# Patient Record
Sex: Female | Born: 1995 | Race: Black or African American | Hispanic: No | Marital: Single | Smoking: Never smoker
Health system: Southern US, Community
[De-identification: ages and names within clinical notes are randomized; demographics above are authoritative.]

## PROBLEM LIST (undated history)

## (undated) DIAGNOSIS — E0500 Thyrotoxicosis with diffuse goiter without thyrotoxic crisis or storm: Secondary | ICD-10-CM

---

## 2015-08-18 ENCOUNTER — Emergency Department (HOSPITAL_COMMUNITY): Payer: Self-pay

## 2015-08-18 ENCOUNTER — Encounter (HOSPITAL_COMMUNITY): Payer: Self-pay | Admitting: *Deleted

## 2015-08-18 ENCOUNTER — Emergency Department (HOSPITAL_COMMUNITY)
Admission: EM | Admit: 2015-08-18 | Discharge: 2015-08-18 | Disposition: A | Discharge status: Home / Self Care | Payer: Medicaid - Out of State | Attending: Emergency Medicine | Admitting: Emergency Medicine

## 2015-08-18 DIAGNOSIS — W01198A Fall on same level from slipping, tripping and stumbling with subsequent striking against other object, initial encounter: Secondary | ICD-10-CM | POA: Insufficient documentation

## 2015-08-18 DIAGNOSIS — R55 Syncope and collapse: Secondary | ICD-10-CM | POA: Insufficient documentation

## 2015-08-18 DIAGNOSIS — Y9389 Activity, other specified: Secondary | ICD-10-CM | POA: Insufficient documentation

## 2015-08-18 DIAGNOSIS — Y998 Other external cause status: Secondary | ICD-10-CM | POA: Insufficient documentation

## 2015-08-18 DIAGNOSIS — Y9289 Other specified places as the place of occurrence of the external cause: Secondary | ICD-10-CM | POA: Insufficient documentation

## 2015-08-18 DIAGNOSIS — S0990XA Unspecified injury of head, initial encounter: Secondary | ICD-10-CM | POA: Insufficient documentation

## 2015-08-18 LAB — I-STAT BETA HCG BLOOD, ED (MC, WL, AP ONLY): I-stat hCG, quantitative: <5 m[IU]/mL (ref <5)

## 2015-08-18 LAB — CBC
HEMATOCRIT: 37.4 % (ref 36.0–46.0)
HEMOGLOBIN: 12.1 g/dL (ref 12.0–15.0)
MCH: 26.2 pg (ref 26.0–34.0)
MCHC: 32.4 g/dL (ref 30.0–36.0)
MCV: 81.1 fL (ref 78.0–100.0)
Platelets: 237 10*3/uL (ref 150–400)
RBC: 4.61 MIL/uL (ref 3.87–5.11)
RDW: 15.9 % — AB (ref 11.5–15.5)
WBC: 4.3 10*3/uL (ref 4.0–10.5)

## 2015-08-18 LAB — BASIC METABOLIC PANEL
ANION GAP: 8 (ref 5–15)
BUN: 12 mg/dL (ref 6–20)
CHLORIDE: 105 mmol/L (ref 101–111)
CO2: 24 mmol/L (ref 22–32)
Calcium: 9.3 mg/dL (ref 8.9–10.3)
Creatinine, Ser: 0.76 mg/dL (ref 0.44–1.00)
GFR calc non Af Amer: >60 mL/min (ref >60)
Glucose, Bld: 89 mg/dL (ref 65–99)
Potassium: 4.1 mmol/L (ref 3.5–5.1)
Sodium: 137 mmol/L (ref 135–145)

## 2015-08-18 LAB — URINALYSIS, ROUTINE W REFLEX MICROSCOPIC
Bilirubin Urine: NEGATIVE
Glucose, UA: NEGATIVE mg/dL
Ketones, ur: NEGATIVE mg/dL
LEUKOCYTES UA: NEGATIVE
NITRITE: NEGATIVE
PH: 5.5 (ref 5.0–8.0)
Protein, ur: NEGATIVE mg/dL
SPECIFIC GRAVITY, URINE: 1.015 (ref 1.005–1.030)
Urobilinogen, UA: 0.2 mg/dL (ref 0.0–1.0)

## 2015-08-18 LAB — URINE MICROSCOPIC-ADD ON

## 2015-08-18 MED ORDER — NAPROXEN 500 MG PO TABS: 500.0000 mg | ORAL_TABLET | Freq: Two times a day (BID) | ORAL | Status: DC

## 2015-08-18 NOTE — ED Provider Notes (Signed)
CSN: [id number]     Arrival date & time 08/18/15  1110 History   First MD Initiated Contact with Patient 08/18/15 1632     Chief Complaint  Patient presents with  . Loss of Consciousness    HPI Pt was at a party dancing.  She felt like she was getting overheated.  She started to feel short of breath.  She tried to walk outside but was not able to get outside in time.  Her friend was with her and she fell to the ground.  She hit her head hard on the ground.  Her eyes rolled back.  She jerked a few times but quickly regained consiousness.  She came to the ED because her head is still hurting.   History reviewed. No pertinent past medical history. History reviewed. No pertinent past surgical history. History reviewed. No pertinent family history. Social History  Substance Use Topics  . Smoking status: Never Smoker   . Smokeless tobacco: None  . Alcohol Use: No   OB History    No data available     Review of Systems  Constitutional: Negative for fever and fatigue.  Respiratory: Negative for shortness of breath.   Cardiovascular: Negative for chest pain.  Gastrointestinal: Negative for vomiting, abdominal pain and diarrhea.  Musculoskeletal: Negative for joint swelling.  Neurological: Positive for syncope and headaches. Negative for dizziness.      Allergies  Review of patient's allergies indicates no known allergies.  Home Medications   Prior to Admission medications   Medication Sig Start Date End Date Taking? Authorizing Provider  naproxen (NAPROSYN) 500 MG tablet Take 1 tablet (500 mg total) by mouth 2 (two) times daily. 08/18/15   Linwood Dibbles, MD   BP 115/73 mmHg  Pulse 62  Temp(Src) 98.1 F (36.7 C) (Oral)  Resp 18  Ht  (1.626 m)  Wt 149 lb (67.586 kg)  BMI 25.56 kg/m2  SpO2 98%  LMP 07/29/2015 Physical Exam  Constitutional: She appears well-developed and well-nourished. No distress.  HENT:  Head: Normocephalic and atraumatic.  Right Ear: External ear  normal.  Left Ear: External ear normal.  Eyes: Conjunctivae are normal. Right eye exhibits no discharge. Left eye exhibits no discharge. No scleral icterus.  Neck: Neck supple. No tracheal deviation present.  Cardiovascular: Normal rate, regular rhythm and intact distal pulses.   Pulmonary/Chest: Effort normal and breath sounds normal. No stridor. No respiratory distress. She has no wheezes. She has no rales.  Abdominal: Soft. Bowel sounds are normal. She exhibits no distension. There is no tenderness. There is no rebound and no guarding.  Musculoskeletal: She exhibits no edema or tenderness.  Neurological: She is alert. She has normal strength. No cranial nerve deficit (no facial droop, extraocular movements intact, no slurred speech) or sensory deficit. She exhibits normal muscle tone. She displays no seizure activity. Coordination normal.  Skin: Skin is warm and dry. No rash noted.  Psychiatric: She has a normal mood and affect.  Nursing note and vitals reviewed.   ED Course  Procedures (including critical care time) Labs Review Labs Reviewed  CBC - Abnormal; Notable for the following:    RDW 15.9 (*)    All other components within normal limits  URINALYSIS, ROUTINE W REFLEX MICROSCOPIC (NOT AT [facility]) - Abnormal; Notable for the following:    Hgb urine dipstick SMALL (*)    All other components within normal limits  BASIC METABOLIC PANEL  URINE MICROSCOPIC-ADD ON  I-STAT BETA HCG BLOOD, ED ([facility],  [facility], [facility] ONLY)    Imaging Review Ct Head Wo Contrast  08/18/2015   CLINICAL DATA:  Syncopal episode occurring yesterday. Patient struck head, now with headache.  EXAM: CT HEAD WITHOUT CONTRAST  TECHNIQUE: Contiguous axial images were obtained from the base of the skull through the vertex without intravenous contrast.  COMPARISON:  None.  FINDINGS: No evidence for acute infarction, hemorrhage, mass lesion, hydrocephalus, or extra-axial fluid. No atrophy or white matter disease. Intact calvarium.  No acute sinus or mastoid disease.  IMPRESSION: Negative.   Electronically Signed   By: Elsie Stain M.D.   On: 08/18/2015 17:44    MDM   Final diagnoses:  Syncope, unspecified syncope type  Head injury without skull fracture, initial encounter    Suspect vasovagal episode with become overheated last evening.  Normal exam today.  Doubt cardiac etiology.  No significant head injury on CT.  At this time there does not appear to be any evidence of an acute emergency medical condition and the patient appears stable for discharge with appropriate outpatient follow up.     Linwood Dibbles, MD 08/18/15 [phone]

## 2015-08-18 NOTE — ED Notes (Signed)
Pt reports being at party last night and was hot and dancing, had possible syncopal episode. Reports shaking and hitting her head, has headache this am. Denies n/v. No acute distress noted. Denies etoh.

## 2015-08-18 NOTE — ED Notes (Signed)
Pt on her cell phone.  Alert talking on her cell smiling  No relief.

## 2015-08-18 NOTE — Discharge Instructions (Signed)
Concussion  A concussion, or closed-head injury, is a brain injury caused by a direct blow to the head or by a quick and sudden movement (jolt) of the head or neck. Concussions are usually not life-threatening. Even so, the effects of a concussion can be serious. If you have had a concussion before, you are more likely to experience concussion-like symptoms after a direct blow to the head.   CAUSES  · Direct blow to the head, such as from running into another player during a soccer game, being hit in a fight, or hitting your head on a hard surface.  · A jolt of the head or neck that causes the brain to move back and forth inside the skull, such as in a car crash.  SIGNS AND SYMPTOMS  The signs of a concussion can be hard to notice. Early on, they may be missed by you, family members, and health care providers. You may look fine but act or feel differently.  Symptoms are usually temporary, but they may last for days, weeks, or even longer. Some symptoms may appear right away while others may not show up for hours or days. Every head injury is different. Symptoms include:  · Mild to moderate headaches that will not go away.  · A feeling of pressure inside your head.  · Having more trouble than usual:  ¨ Learning or remembering things you have heard.  ¨ Answering questions.  ¨ Paying attention or concentrating.  ¨ Organizing daily tasks.  ¨ Making decisions and solving problems.  · Slowness in thinking, acting or reacting, speaking, or reading.  · Getting lost or being easily confused.  · Feeling tired all the time or lacking energy (fatigued).  · Feeling drowsy.  · Sleep disturbances.  ¨ Sleeping more than usual.  ¨ Sleeping less than usual.  ¨ Trouble falling asleep.  ¨ Trouble sleeping (insomnia).  · Loss of balance or feeling lightheaded or dizzy.  · Nausea or vomiting.  · Numbness or tingling.  · Increased sensitivity to:  ¨ Sounds.  ¨ Lights.  ¨ Distractions.  · Vision problems or eyes that tire  easily.  · Diminished sense of taste or smell.  · Ringing in the ears.  · Mood changes such as feeling sad or anxious.  · Becoming easily irritated or angry for little or no reason.  · Lack of motivation.  · Seeing or hearing things other people do not see or hear (hallucinations).  DIAGNOSIS  Your health care provider can usually diagnose a concussion based on a description of your injury and symptoms. He or she will ask whether you passed out (lost consciousness) and whether you are having trouble remembering events that happened right before and during your injury.  Your evaluation might include:  · A brain scan to look for signs of injury to the brain. Even if the test shows no injury, you may still have a concussion.  · Blood tests to be sure other problems are not present.  TREATMENT  · Concussions are usually treated in an emergency department, in urgent care, or at a clinic. You may need to stay in the hospital overnight for further treatment.  · Tell your health care provider if you are taking any medicines, including prescription medicines, over-the-counter medicines, and natural remedies. Some medicines, such as blood thinners (anticoagulants) and aspirin, may increase the chance of complications. Also tell your health care provider whether you have had alcohol or are taking illegal drugs. This information   may affect treatment.  · Your health care provider will send you home with important instructions to follow.  · How fast you will recover from a concussion depends on many factors. These factors include how severe your concussion is, what part of your brain was injured, your age, and how healthy you were before the concussion.  · Most people with mild injuries recover fully. Recovery can take time. In general, recovery is slower in older persons. Also, persons who have had a concussion in the past or have other medical problems may find that it takes longer to recover from their current injury.  HOME  CARE INSTRUCTIONS  General Instructions  · Carefully follow the directions your health care provider gave you.  · Only take over-the-counter or prescription medicines for pain, discomfort, or fever as directed by your health care provider.  · Take only those medicines that your health care provider has approved.  · Do not drink alcohol until your health care provider says you are well enough to do so. Alcohol and certain other drugs may slow your recovery and can put you at risk of further injury.  · If it is harder than usual to remember things, write them down.  · If you are easily distracted, try to do one thing at a time. For example, do not try to watch TV while fixing dinner.  · Talk with family members or close friends when making important decisions.  · Keep all follow-up appointments. Repeated evaluation of your symptoms is recommended for your recovery.  · Watch your symptoms and tell others to do the same. Complications sometimes occur after a concussion. Older adults with a brain injury may have a higher risk of serious complications, such as a blood clot on the brain.  · Tell your teachers, school nurse, school counselor, coach, athletic trainer, or work manager about your injury, symptoms, and restrictions. Tell them about what you can or cannot do. They should watch for:  ¨ Increased problems with attention or concentration.  ¨ Increased difficulty remembering or learning new information.  ¨ Increased time needed to complete tasks or assignments.  ¨ Increased irritability or decreased ability to cope with stress.  ¨ Increased symptoms.  · Rest. Rest helps the brain to heal. Make sure you:  ¨ Get plenty of sleep at night. Avoid staying up late at night.  ¨ Keep the same bedtime hours on weekends and weekdays.  ¨ Rest during the day. Take daytime naps or rest breaks when you feel tired.  · Limit activities that require a lot of thought or concentration. These include:  ¨ Doing homework or job-related  work.  ¨ Watching TV.  ¨ Working on the computer.  · Avoid any situation where there is potential for another head injury (football, hockey, soccer, basketball, martial arts, downhill snow sports and horseback riding). Your condition will get worse every time you experience a concussion. You should avoid these activities until you are evaluated by the appropriate follow-up health care providers.  Returning To Your Regular Activities  You will need to return to your normal activities slowly, not all at once. You must give your body and brain enough time for recovery.  · Do not return to sports or other athletic activities until your health care provider tells you it is safe to do so.  · Ask your health care provider when you can drive, ride a bicycle, or operate heavy machinery. Your ability to react may be slower after a   brain injury. Never do these activities if you are dizzy.  · Ask your health care provider about when you can return to work or school.  Preventing Another Concussion  It is very important to avoid another brain injury, especially before you have recovered. In rare cases, another injury can lead to permanent brain damage, brain swelling, or death. The risk of this is greatest during the first 7-10 days after a head injury. Avoid injuries by:  · Wearing a seat belt when riding in a car.  · Drinking alcohol only in moderation.  · Wearing a helmet when biking, skiing, skateboarding, skating, or doing similar activities.  · Avoiding activities that could lead to a second concussion, such as contact or recreational sports, until your health care provider says it is okay.  · Taking safety measures in your home.  ¨ Remove clutter and tripping hazards from floors and stairways.  ¨ Use grab bars in bathrooms and handrails by stairs.  ¨ Place non-slip mats on floors and in bathtubs.  ¨ Improve lighting in dim areas.  SEEK MEDICAL CARE IF:  · You have increased problems paying attention or  concentrating.  · You have increased difficulty remembering or learning new information.  · You need more time to complete tasks or assignments than before.  · You have increased irritability or decreased ability to cope with stress.  · You have more symptoms than before.  Seek medical care if you have any of the following symptoms for more than 2 weeks after your injury:  · Lasting (chronic) headaches.  · Dizziness or balance problems.  · Nausea.  · Vision problems.  · Increased sensitivity to noise or light.  · Depression or mood swings.  · Anxiety or irritability.  · Memory problems.  · Difficulty concentrating or paying attention.  · Sleep problems.  · Feeling tired all the time.  SEEK IMMEDIATE MEDICAL CARE IF:  · You have severe or worsening headaches. These may be a sign of a blood clot in the brain.  · You have weakness (even if only in one hand, leg, or part of the face).  · You have numbness.  · You have decreased coordination.  · You vomit repeatedly.  · You have increased sleepiness.  · One pupil is larger than the other.  · You have convulsions.  · You have slurred speech.  · You have increased confusion. This may be a sign of a blood clot in the brain.  · You have increased restlessness, agitation, or irritability.  · You are unable to recognize people or places.  · You have neck pain.  · It is difficult to wake you up.  · You have unusual behavior changes.  · You lose consciousness.  MAKE SURE YOU:  · Understand these instructions.  · Will watch your condition.  · Will get help right away if you are not doing well or get worse.  Document Released: 02/14/2004 Document Revised: 11/29/2013 Document Reviewed: 06/16/2013  [employer]® Patient Information ©2015 [employer]. This information is not intended to replace advice given to you by your health care provider. Make sure you discuss any questions you have with your health care provider.

## 2015-08-18 NOTE — ED Notes (Signed)
Pt going to ct  

## 2016-04-16 ENCOUNTER — Emergency Department (HOSPITAL_COMMUNITY)
Admission: EM | Admit: 2016-04-16 | Discharge: 2016-04-16 | Disposition: A | Discharge status: Home / Self Care | Payer: Medicaid - Out of State | Attending: Emergency Medicine | Admitting: Emergency Medicine

## 2016-04-16 DIAGNOSIS — Z791 Long term (current) use of non-steroidal anti-inflammatories (NSAID): Secondary | ICD-10-CM | POA: Insufficient documentation

## 2016-04-16 DIAGNOSIS — T50905A Adverse effect of unspecified drugs, medicaments and biological substances, initial encounter: Secondary | ICD-10-CM

## 2016-04-16 DIAGNOSIS — T887XXA Unspecified adverse effect of drug or medicament, initial encounter: Secondary | ICD-10-CM | POA: Insufficient documentation

## 2016-04-16 DIAGNOSIS — Y658 Other specified misadventures during surgical and medical care: Secondary | ICD-10-CM | POA: Insufficient documentation

## 2016-04-16 DIAGNOSIS — T391X5A Adverse effect of 4-Aminophenol derivatives, initial encounter: Secondary | ICD-10-CM | POA: Insufficient documentation

## 2016-04-16 NOTE — ED Notes (Signed)
Pt reports feeling shaky since taking generic acetaminophen this am. Denies any allergies, rash, itching, or feeling of throat/face swelling. Pt concerned for allergic reaction. No visible shaking in triage.

## 2016-04-16 NOTE — ED Provider Notes (Signed)
CSN: [id number]     Arrival date & time 04/16/16  4098 History   First MD Initiated Contact with Patient 04/16/16 1122     Chief Complaint  Patient presents with  . feels shaky      (Consider location/radiation/quality/duration/timing/severity/associated sxs/prior Treatment) HPI Comments: Margaret Moon is a 20 y.o. female presents to ED with complaint of "shakiness." Patient stated taking two  off-brand ([facility]) tylenol this morning for a headache. She gagged the pills back up and reports "shakiness" following. She did visualize both pills coming back up. States she usually only takes one pill at a time, but got "bold" and tried taking both. Patient is currently asymptomatic. She has never had a similar reaction. Denies any drug allergies. No shortness of breath, chest pain, difficulty breathing. No facial swelling, throat swelling, oral swelling, or difficulty swallowing. No rashes, hives, or itching. No fever, chills, or night sweat. No dizziness, light headedness, loss of consciousness, weakness, or paraesthesias. No abdominal pain, nausea, or vomiting.   The history is provided by the patient.    No past medical history on file. No past surgical history on file. No family history on file. Social History  Substance Use Topics  . Smoking status: Never Smoker   . Smokeless tobacco: Not on file  . Alcohol Use: No   OB History    No data available     Review of Systems  Allergic/Immunologic: Positive for environmental allergies (Seasonal - pollen).  Neurological: Positive for headaches ( Mild 3/10, "typical,normal of my headaches" ).  All other systems reviewed and are negative.     Allergies  Review of patient's allergies indicates no known allergies.  Home Medications   Prior to Admission medications   Medication Sig Start Date End Date Taking? Authorizing Provider  naproxen (NAPROSYN) 500 MG tablet Take 1 tablet (500 mg total) by mouth 2 (two) times daily.  08/18/15   Linwood Dibbles, MD   BP 131/77 mmHg  Pulse 65  Temp(Src) 98.5 F (36.9 C) (Oral)  Resp 18  SpO2 98% Physical Exam  Constitutional: She appears well-developed and well-nourished. No distress.  HENT:  Head: Normocephalic and atraumatic.  Mouth/Throat: Oropharynx is clear and moist. No oropharyngeal exudate.  Eyes: Conjunctivae and EOM are normal. Pupils are equal, round, and reactive to light. Right eye exhibits no discharge. Left eye exhibits no discharge. No scleral icterus.  Neck: Normal range of motion. Neck supple.  Cardiovascular: Normal rate, regular rhythm, normal heart sounds and intact distal pulses.   No murmur heard. Pulmonary/Chest: Effort normal and breath sounds normal. No respiratory distress.  Abdominal: Soft. Bowel sounds are normal. There is no tenderness. There is no rebound and no guarding.  Musculoskeletal: Normal range of motion.  Lymphadenopathy:    She has no cervical adenopathy.  Neurological: She is alert. She has normal strength. No cranial nerve deficit or sensory deficit. Coordination and gait normal.  Skin: Skin is warm and dry. She is not diaphoretic.  Psychiatric: She has a normal mood and affect. Her behavior is normal.    ED Course  Procedures (including critical care time) Labs Review Labs Reviewed - No data to display  Imaging Review No results found. I have personally reviewed and evaluated these images and lab results as part of my medical decision-making.   EKG Interpretation None      MDM   Final diagnoses:  Medication reaction, initial encounter    Margaret Moon is a 20 y.o. female reports to ED for "  shakiness" after taking off-brand tylenol. She is currently asymptomatic, states her symptoms are "completely resolved." Her headache is her "typical, normal headache."  Patient is afebrile, non-toxic, and VSS. She is not shaking during my exam. Physical exam is unremarkable - no facial or oral swelling, no  rashes/hives/itching, no stridor, lungs are clear to auscultation, and she is neurologically intact. Discussed with patient not using medication in future. Discussed return precautions. Patient voiced understanding and is agreeable.       Lona KettleAshley Laurel Meyer, [place] JerseyPA-C 04/16/16 [phone]

## 2016-04-16 NOTE — ED Notes (Signed)
Bed: [facility] Expected date:  Expected time:  Means of arrival:  Comments: 

## 2016-04-16 NOTE — Discharge Instructions (Signed)
Read the information below. Avoid taking the medication that caused the side effects.  You may return to the Emergency Department at any time for worsening condition or any new symptoms that concern you. Return to ED if you develop shortness of breath, chest pain, difficulty breathing, difficulty swallowing, facial/oral swelling, or loss of consciousness. Follow up with your PCP, below are resources for establishing a PCP.   AllstateCommunity Resource Guide Financial Assistance The United Ways 211 is a great source of information about community services available.  Access by dialing 2-1-1 from anywhere in [place], or by website -  PooledIncome.plwww.nc211.org.   Other Local Resources (Updated 12/2015)  Financial Assistance   Services    Phone Number and Address  [facility]  Low-cost medical care - 1st and 3rd Saturday of every month  Must not qualify for public or private insurance and must have limited income [phone] [place], [place]    [place] Department of Social Services  Child care  Emergency assistance for housing and Kimberly-Clarkutilities  Food stamps  Medicaid [phone] [address] [place], [place] [address]   [facility]  Low-cost medical care for children, communicable diseases, sexually-transmitted diseases, immunizations, maternity care, womens health and family planning [phone] [address] [place], [place] [address]  [facility]   Medication assistance for [facility] [place] residents  Must meet income requirements [phone] [address] [place], [place].    [facility]  Child care  Emergency assistance for housing and Kimberly-Clarkutilities  Food stamps  Medicaid [phone] [address] [place], [place] [address]  [facility]   Low-cost medical care,   Monday through Friday, 9 am to 6 pm.    Accepts Medicare/Medicaid, and self-pay [phone] [address] [place], [place] [address]  [facility]  Low-cost medical care - Monday through Friday, 8:30 am - 5:30 pm  Accepts Medicaid and self-pay [phone] [address] [place], [place] [address]   [facility]  Primary medical care, including for those with sickle cell disease  Accepts Medicare, Medicaid, insurance and self-pay [phone] [address] [place], [place]  [facility]   Primary medical care  Accepts Medicare, [place], insurance and self-pay [phone] [place], [place] [address]   [facility] Social Services  Child care  Emergency assistance for housing and Kimberly-Clarkutilities  Food stamps  Medicaid [phone] [address] [place], [place] [address]  [facility] and [employer]Human Services  Child care  Emergency assistance for housing and Kimberly-Clarkutilities  Food stamps  Medicaid [phone] [address] [place], [place] [address]   [facility]  Medication assistance for [facility] [place] residents with no insurance only  Must have a primary care doctor [phone] [address] [place], [place]  [facility]   Primary medical care  [place] Medicare, [place], insurance  [phone] [address] [place], [place]  [facility]   Medication assistance [phone]  [facility]   Primary medical care  Accepts Medicare, [place], insurance and self-pay [phone] [address] [place], [place] [address]  [facility]   Primary medical care  Accepts Medicare, [place], insurance and self-pay [phone] [address] [place], [place] [address]  [facility]  For [place] residents between the ages  of 18 and 64 who do not have  any form of health insurance, Medicare, Medicaid, or VA benefits.  Services are provided free of charge to uninsured patients who fall within federal poverty guidelines.    Hours: Tuesdays and Thursdays, 4:15 - 8 pm [phone] [address] [place], [place] [address]  [facility]     Primary medical care  Dental care  Nutritional counseling  Pharmacy  Accepts Medicaid, Medicare, most insurance.  Fees are adjusted based on ability to pay.   [phone] [facility] [address] [place], [place]  [phone] [facility] [address] [place], [place]  [phone] [facility] [place], [place]  [phone] [facility], [address] [place], [place]  [phone] [facility] [address] [place], [place]  [facility]  Womens health and family planning [phone] [address] [place], [place]  [facility] Department of Social Services  Child care  Emergency assistance for housing and [employer]ark  Medicaid [phone] N[address] [place], [place] [address]   [facility]    Ages 53 and older  Hours: Mondays and Thursdays, 7:00 am - 9:00 am Patients are seen on a first come, first served basis. [phone], [phone] [place], [place]  [facility] of Social Services  Child care  Emergency assistance for housing and [employer]ark  Medicaid [phone] [address] [place], [place] [address]  [facility] [employer]  Medication assistance  Rental assistance  Food pantry  Medication assistance  Housing assistance  Emergency food distribution  Utility assistance [phone] [address] [place], [place]  [phone]  [address] [place], [place] [address] Hours: Tuesdays and Thursdays from 9am -  12 noon by appointment only  [phone] [address] [place], [place] [address]  [facility] Lanae Boast   Accepts private insurance, [place], and [place].  Payment is based on a sliding scale for those without insurance.  Hours: Mondays, Tuesdays and Thursdays, 8:30 am - 5:30 pm.   [phone] [address] [place], [place]  [facility] - Family Medicine at [facility], [place], and [place].  Payment is based on a sliding scale for those without insurance. [phone] [address] [place], [place]  [facility] - Pediatrics at E. Scientist, research (physical sciences), [employer], and [place].  Payment is based on a sliding scale for those without insurance [phone] [address], [employer], [place]  [facility] - Pediatrics at [employer], [place], and [place].  Payment is based on a sliding scale for those without insurance. [phone] [address] [place], [place]  [facility] - Pediatrics at [facility], [place], and [place].  Payment is based on a sliding scale for those without insurance. [phone], [phone] [place], [place].    [facility]  Maternity care.  Accepts Medicaid and self-pay. [phone] [address] [place], [place]

## 2017-09-16 ENCOUNTER — Encounter (HOSPITAL_COMMUNITY): Payer: Self-pay

## 2017-09-16 ENCOUNTER — Emergency Department (HOSPITAL_COMMUNITY)
Admission: EM | Admit: 2017-09-16 | Discharge: 2017-09-16 | Discharge status: Against Medical Advice | Payer: Medicaid - Out of State | Attending: Emergency Medicine | Admitting: Emergency Medicine

## 2017-09-16 DIAGNOSIS — R1084 Generalized abdominal pain: Secondary | ICD-10-CM | POA: Diagnosis present

## 2017-09-16 DIAGNOSIS — Z5321 Procedure and treatment not carried out due to patient leaving prior to being seen by health care provider: Secondary | ICD-10-CM | POA: Insufficient documentation

## 2017-09-16 DIAGNOSIS — R110 Nausea: Secondary | ICD-10-CM | POA: Insufficient documentation

## 2017-09-16 LAB — COMPREHENSIVE METABOLIC PANEL
ALK PHOS: 40 U/L (ref 38–126)
ALT: 9 U/L — AB (ref 14–54)
ANION GAP: 11 (ref 5–15)
AST: 21 U/L (ref 15–41)
Albumin: 4.1 g/dL (ref 3.5–5.0)
BILIRUBIN TOTAL: 0.5 mg/dL (ref 0.3–1.2)
BUN: 9 mg/dL (ref 6–20)
CALCIUM: 8.9 mg/dL (ref 8.9–10.3)
CHLORIDE: 106 mmol/L (ref 101–111)
CO2: 22 mmol/L (ref 22–32)
Creatinine, Ser: 0.73 mg/dL (ref 0.44–1.00)
GFR calc non Af Amer: >60 mL/min (ref >60)
Glucose, Bld: 151 mg/dL — ABNORMAL HIGH (ref 65–99)
POTASSIUM: 3.2 mmol/L — AB (ref 3.5–5.1)
SODIUM: 139 mmol/L (ref 135–145)
Total Protein: 7.3 g/dL (ref 6.5–8.1)

## 2017-09-16 LAB — I-STAT BETA HCG BLOOD, ED (MC, WL, AP ONLY)

## 2017-09-16 LAB — CBC
HEMATOCRIT: 36.1 % (ref 36.0–46.0)
HEMOGLOBIN: 12.1 g/dL (ref 12.0–15.0)
MCH: 28.4 pg (ref 26.0–34.0)
MCHC: 33.5 g/dL (ref 30.0–36.0)
MCV: 84.7 fL (ref 78.0–100.0)
Platelets: 217 10*3/uL (ref 150–400)
RBC: 4.26 MIL/uL (ref 3.87–5.11)
RDW: 14.2 % (ref 11.5–15.5)
WBC: 7.6 10*3/uL (ref 4.0–10.5)

## 2017-09-16 LAB — LIPASE, BLOOD: Lipase: 16 U/L (ref 11–51)

## 2017-09-16 MED ORDER — ONDANSETRON 4 MG PO TBDP
4.0000 mg | ORAL_TABLET | Freq: Once | ORAL | Status: AC | PRN
  Administered 2017-09-16: 4 mg via ORAL

## 2017-09-16 MED ORDER — ONDANSETRON 4 MG PO TBDP
ORAL_TABLET | ORAL | Status: AC
  Filled 2017-09-16: qty 1

## 2017-09-16 MED ORDER — OXYCODONE-ACETAMINOPHEN 5-325 MG PO TABS: 1.0000 | ORAL_TABLET | ORAL | Status: DC | PRN

## 2017-09-16 MED ORDER — OXYCODONE-ACETAMINOPHEN 5-325 MG PO TABS
ORAL_TABLET | ORAL | Status: AC
  Filled 2017-09-16: qty 1

## 2017-09-16 NOTE — ED Notes (Signed)
Called pt x3 for vitals, no response. 

## 2017-09-16 NOTE — ED Notes (Addendum)
NOT given the percocet due to return of nausea

## 2017-09-16 NOTE — ED Triage Notes (Signed)
Patient complains of abdominal cramping and nausea, vomiting and diarrhea this am. States that she thinks may be related to menstrual cycle. States that she normally doesn't have diarrhea related to period. Patient reports that she started this am

## 2017-09-16 NOTE — ED Notes (Signed)
No answer. LWBS

## 2018-03-09 ENCOUNTER — Encounter (HOSPITAL_COMMUNITY): Payer: Self-pay | Admitting: Emergency Medicine

## 2018-03-09 ENCOUNTER — Ambulatory Visit (HOSPITAL_COMMUNITY)
Admission: EM | Admit: 2018-03-09 | Discharge: 2018-03-09 | Disposition: A | Discharge status: Home / Self Care | Payer: Medicaid - Out of State | Attending: Family Medicine | Admitting: Family Medicine

## 2018-03-09 DIAGNOSIS — R112 Nausea with vomiting, unspecified: Secondary | ICD-10-CM

## 2018-03-09 DIAGNOSIS — R197 Diarrhea, unspecified: Secondary | ICD-10-CM

## 2018-03-09 DIAGNOSIS — B349 Viral infection, unspecified: Secondary | ICD-10-CM | POA: Diagnosis not present

## 2018-03-09 MED ORDER — CETIRIZINE HCL 10 MG PO TABS: 10.0000 mg | ORAL_TABLET | Freq: Every day | ORAL | 0 refills | Status: DC

## 2018-03-09 MED ORDER — ONDANSETRON 4 MG PO TBDP: 4.0000 mg | ORAL_TABLET | Freq: Three times a day (TID) | ORAL | 0 refills | Status: DC | PRN

## 2018-03-09 MED ORDER — IPRATROPIUM BROMIDE 0.06 % NA SOLN: 2.0000 | Freq: Four times a day (QID) | NASAL | 0 refills | Status: DC

## 2018-03-09 MED ORDER — DICYCLOMINE HCL 20 MG PO TABS: 20.0000 mg | ORAL_TABLET | Freq: Two times a day (BID) | ORAL | 0 refills | Status: DC

## 2018-03-09 MED ORDER — BENZONATATE 100 MG PO CAPS: 100.0000 mg | ORAL_CAPSULE | Freq: Three times a day (TID) | ORAL | 0 refills | Status: DC

## 2018-03-09 NOTE — ED Provider Notes (Signed)
[facility]    CSN: [id number] Arrival date & time: 03/09/18  1443     History   Chief Complaint Chief Complaint  Patient presents with  . Emesis    HPI Margaret Moon is a 22 y.o. female.   22 year old female comes in for 1 week history of URI symptoms. Rhinorrhea, nasal congestion, productive cough. Denies sore throat. Fever, Tmax 101 with chills. Started having episodes of nausea and vomiting last night with 3 episodes of nonbloody vomit. Diarrhea, 6 episodes.  Occasional low abdominal pain that improves after emesis and diarrhea.  Otc robittusin, theraflu with some relief.  Patient states that she started her cycle a few days ago and she does experience cyclic nausea, vomiting, weakness, dizziness. Denies syncope.  Has in the past to stay with home during these episodes. States diarrhea is not normal for her when she is on her cycle. She is sexually active with 1 female partner.      History reviewed. No pertinent past medical history.  There are no active problems to display for this patient.   History reviewed. No pertinent surgical history.  OB History   None      Home Medications    Prior to Admission medications   Medication Sig Start Date End Date Taking? Authorizing Provider  benzonatate (TESSALON) 100 MG capsule Take 1 capsule (100 mg total) by mouth every 8 (eight) hours. 03/09/18   Cathie Hoops, Tayden Nichelson V, PA-C  cetirizine (ZYRTEC) 10 MG tablet Take 1 tablet (10 mg total) by mouth daily. 03/09/18   Cathie Hoops, Shawnita Krizek V, PA-C  dicyclomine (BENTYL) 20 MG tablet Take 1 tablet (20 mg total) by mouth 2 (two) times daily. 03/09/18   Cathie Hoops, Braydyn Schultes V, PA-C  ipratropium (ATROVENT) 0.06 % nasal spray Place 2 sprays into both nostrils 4 (four) times daily. 03/09/18   Cathie Hoops, Keywon Mestre V, PA-C  naproxen (NAPROSYN) 500 MG tablet Take 1 tablet (500 mg total) by mouth 2 (two) times daily. 08/18/15   Linwood Dibbles, MD  ondansetron (ZOFRAN ODT) 4 MG disintegrating tablet Take 1 tablet (4 mg total) by mouth every 8  (eight) hours as needed for nausea or vomiting. 03/09/18   Belinda Fisher, PA-C    Family History History reviewed. No pertinent family history.  Social History Social History   Tobacco Use  . Smoking status: Never Smoker  . Smokeless tobacco: Never Used  Substance Use Topics  . Alcohol use: No  . Drug use: Yes    Types: Marijuana     Allergies   Patient has no known allergies.   Review of Systems Review of Systems  Reason unable to perform ROS: See HPI as above.     Physical Exam Triage Vital Signs ED Triage Vitals [03/09/18 1512]  Enc Vitals Group     BP 133/65     Pulse Rate 91     Resp 18     Temp 99.1 F (37.3 C)     Temp Source Oral     SpO2 98 %     Weight      Height      Head Circumference      Peak Flow      Pain Score      Pain Loc      Pain Edu?      Excl. in GC?    No data found.  Updated Vital Signs BP 133/65 (BP Location: Right Arm)   Pulse 91   Temp 99.1 F (  37.3 C) (Oral)   Resp 18   SpO2 98%   Physical Exam  Constitutional: She is oriented to person, place, and time. She appears well-developed and well-nourished. No distress.  HENT:  Head: Normocephalic and atraumatic.  Right Ear: Tympanic membrane, external ear and ear canal normal. Tympanic membrane is not erythematous and not bulging.  Left Ear: Tympanic membrane, external ear and ear canal normal. Tympanic membrane is not erythematous and not bulging.  Nose: Rhinorrhea present. Right sinus exhibits no maxillary sinus tenderness and no frontal sinus tenderness. Left sinus exhibits no maxillary sinus tenderness and no frontal sinus tenderness.  Mouth/Throat: Uvula is midline, oropharynx is clear and moist and mucous membranes are normal.  Eyes: Pupils are equal, round, and reactive to light. Conjunctivae are normal.  Neck: Normal range of motion. Neck supple.  Cardiovascular: Normal rate, regular rhythm and normal heart sounds. Exam reveals no gallop and no friction rub.  No murmur  heard. Pulmonary/Chest: Effort normal and breath sounds normal. She has no decreased breath sounds. She has no wheezes. She has no rhonchi. She has no rales.  Abdominal: Soft. Bowel sounds are normal. She exhibits no mass. There is no rigidity, no rebound, no guarding and no CVA tenderness.  Mild generalized tenderness to palpation without guarding or rebound.  Lymphadenopathy:    She has no cervical adenopathy.  Neurological: She is alert and oriented to person, place, and time. She is not disoriented. Coordination and gait normal. GCS eye subscore is 4. GCS verbal subscore is 5. GCS motor subscore is 6.  Skin: Skin is warm and dry.  Psychiatric: She has a normal mood and affect. Her behavior is normal. Judgment normal.     UC Treatments / Results  Labs (all labs ordered are listed, but only abnormal results are displayed) Labs Reviewed - No data to display  EKG None Radiology No results found.  Procedures Procedures (including critical care time)  Medications Ordered in UC Medications - No data to display   Initial Impression / Assessment and Plan / UC Course  I have reviewed the triage vital signs and the nursing notes.  Pertinent labs & imaging results that were available during my care of the patient were reviewed by me and considered in my medical decision making (see chart for details).    Discussed with patient history and exam most consistent with viral URI. Zofran for N/V. Bentyl for abdominal cramping. Other symptomatic treatment as needed. Push fluids. Return precautions given.   Will have patient follow up with GYN for cyclic N/V, weakness/dizziness.  Final Clinical Impressions(s) / UC Diagnoses   Final diagnoses:  Viral illness  Nausea vomiting and diarrhea    ED Discharge Orders        Ordered    benzonatate (TESSALON) 100 MG capsule  Every 8 hours     03/09/18 1537    cetirizine (ZYRTEC) 10 MG tablet  Daily     03/09/18 1537    ipratropium  (ATROVENT) 0.06 % nasal spray  4 times daily     03/09/18 1537    ondansetron (ZOFRAN ODT) 4 MG disintegrating tablet  Every 8 hours PRN     03/09/18 1537    dicyclomine (BENTYL) 20 MG tablet  2 times daily     03/09/18 1537        [address]Pernella Ackerley V, [place] JerseyPA-C 03/09/18 1546

## 2018-03-09 NOTE — ED Triage Notes (Signed)
Pt sts vomiting x 2 days with URI sx

## 2018-03-09 NOTE — Discharge Instructions (Addendum)
Tessalon for cough. Zofran for nausea and vomiting as needed. Start atrovent nasal spray, zyrtec for nasal congestion/drainage. You can use over the counter nasal saline rinse such as neti pot for nasal congestion. Keep hydrated, you urine should be clear to pale yellow in color. Bland diet as attached, advance as tolerated. Tylenol/motrin for fever and pain. Monitor for any worsening of symptoms, chest pain, shortness of breath, wheezing, swelling of the throat, follow up for reevaluation. If experiencing worsening abdominal pain, nausea/vomiting not controlled by medication, unable to jump up and down due to the pain, go to the emergency department for further evaluation.  [facility] to [facility], they will contact you to schedule an appointment. Please follow up for nausea, vomiting, weakness/dizziness when on cycle.

## 2018-07-19 ENCOUNTER — Emergency Department (HOSPITAL_COMMUNITY)
Admission: EM | Admit: 2018-07-19 | Discharge: 2018-07-19 | Disposition: A | Discharge status: Home / Self Care | Payer: Self-pay | Attending: Emergency Medicine | Admitting: Emergency Medicine

## 2018-07-19 ENCOUNTER — Other Ambulatory Visit: Payer: Self-pay

## 2018-07-19 DIAGNOSIS — Z79899 Other long term (current) drug therapy: Secondary | ICD-10-CM | POA: Insufficient documentation

## 2018-07-19 DIAGNOSIS — J029 Acute pharyngitis, unspecified: Secondary | ICD-10-CM | POA: Insufficient documentation

## 2018-07-19 LAB — GROUP A STREP BY PCR: Group A Strep by PCR: NOT DETECTED

## 2018-07-19 MED ORDER — AMOXICILLIN 500 MG PO CAPS: 500.0000 mg | ORAL_CAPSULE | Freq: Three times a day (TID) | ORAL | 0 refills | Status: DC

## 2018-07-19 NOTE — ED Notes (Signed)
Signature pad available at time of pt discharge. Pt verbalized understanding.

## 2018-07-19 NOTE — ED Provider Notes (Signed)
[facility] Note   CSN: [id number] Arrival date & time: 07/19/18  0401     History   Chief Complaint Chief Complaint  Patient presents with  . Sore Throat    HPI Margaret Moon is a 22 y.o. female.  Patient presents to the emergency department with chief complaint of sore throat.  She states the symptoms have been ongoing for the past 4 days.  She reports fever to 103 degrees tonight.  She took Tylenol with good improvement.  Her symptoms are worsened with swallowing.  She denies any difficulty speaking or breathing.  Denies any cough.  Denies any other associated symptoms.  The history is provided by the patient. No language interpreter was used.    No past medical history on file.  There are no active problems to display for this patient.   No past surgical history on file.   OB History   None      Home Medications    Prior to Admission medications   Medication Sig Start Date End Date Taking? Authorizing Provider  amoxicillin (AMOXIL) 500 MG capsule Take 1 capsule (500 mg total) by mouth 3 (three) times daily. 07/19/18   Roxy HorsemanBrowning, Tarez Bowns, PA-C  benzonatate (TESSALON) 100 MG capsule Take 1 capsule (100 mg total) by mouth every 8 (eight) hours. 03/09/18   Cathie HoopsYu, Amy V, PA-C  cetirizine (ZYRTEC) 10 MG tablet Take 1 tablet (10 mg total) by mouth daily. 03/09/18   Cathie HoopsYu, Amy V, PA-C  dicyclomine (BENTYL) 20 MG tablet Take 1 tablet (20 mg total) by mouth 2 (two) times daily. 03/09/18   Cathie HoopsYu, Amy V, PA-C  ipratropium (ATROVENT) 0.06 % nasal spray Place 2 sprays into both nostrils 4 (four) times daily. 03/09/18   Cathie HoopsYu, Amy V, PA-C  naproxen (NAPROSYN) 500 MG tablet Take 1 tablet (500 mg total) by mouth 2 (two) times daily. 08/18/15   Linwood DibblesKnapp, Jon, MD  ondansetron (ZOFRAN ODT) 4 MG disintegrating tablet Take 1 tablet (4 mg total) by mouth every 8 (eight) hours as needed for nausea or vomiting. 03/09/18   Belinda FisherYu, Amy V, PA-C    Family History No family  history on file.  Social History Social History   Tobacco Use  . Smoking status: Never Smoker  . Smokeless tobacco: Never Used  Substance Use Topics  . Alcohol use: No  . Drug use: Yes    Types: Marijuana     Allergies   Patient has no known allergies.   Review of Systems Review of Systems  All other systems reviewed and are negative.    Physical Exam Updated Vital Signs BP 118/82 (BP Location: Right Arm)   Pulse 100   Temp 99 F (37.2 C) (Oral)   Resp 18   Ht 5\' 4"  (1.626 m)   Wt 62.7 kg   LMP 06/26/2018 (Exact Date)   SpO2 96%   BMI 23.72 kg/m   Physical Exam  Constitutional: She is oriented to person, place, and time. She appears well-developed and well-nourished.  HENT:  Head: Normocephalic and atraumatic.  Oropharynx is erythematous with mild tonsillar exudate, no abscess, uvula is midline, airway intact, no stridor, normal phonation  Eyes: Pupils are equal, round, and reactive to light. Conjunctivae and EOM are normal.  Neck: Normal range of motion. Neck supple.  Cardiovascular: Normal rate and regular rhythm. Exam reveals no gallop and no friction rub.  No murmur heard. Pulmonary/Chest: Effort normal and breath sounds normal. No respiratory distress. She has  no wheezes. She has no rales. She exhibits no tenderness.  Abdominal: Soft. Bowel sounds are normal. She exhibits no distension and no mass. There is no tenderness. There is no rebound and no guarding.  Musculoskeletal: Normal range of motion. She exhibits no edema or tenderness.  Neurological: She is alert and oriented to person, place, and time.  Skin: Skin is warm and dry.  Psychiatric: She has a normal mood and affect. Her behavior is normal. Judgment and thought content normal.  Nursing note and vitals reviewed.    ED Treatments / Results  Labs (all labs ordered are listed, but only abnormal results are displayed) Labs Reviewed  GROUP A STREP BY PCR    EKG None  Radiology No results  found.  Procedures Procedures (including critical care time)  Medications Ordered in ED Medications - No data to display   Initial Impression / Assessment and Plan / ED Course  I have reviewed the triage vital signs and the nursing notes.  Pertinent labs & imaging results that were available during my care of the patient were reviewed by me and considered in my medical decision making (see chart for details).     Patient with sore throat x3 to 4 days.  Fever to 103 degrees tonight.  Rapid strep test is negative.    Will give amox, but have advised to not fill for 2-3 days to see if she improves.  Final Clinical Impressions(s) / ED Diagnoses   Final diagnoses:  Acute pharyngitis, unspecified etiology    ED Discharge Orders         Ordered    amoxicillin (AMOXIL) 500 MG capsule  3 times daily     07/19/18 0556           Roxy HorsemanBrowning, Leandrea Ackley, PA-C 07/19/18 0600    Palumbo, April, MD 07/19/18 [phone]

## 2018-07-19 NOTE — ED Triage Notes (Signed)
Patient c/o sore throat/difficulty swallowing sine Friday and a fever that started a few hours ago; patient took tylenol.

## 2019-06-04 ENCOUNTER — Telehealth: Payer: Self-pay | Admitting: Family

## 2019-06-04 DIAGNOSIS — M546 Pain in thoracic spine: Secondary | ICD-10-CM

## 2019-06-04 NOTE — Progress Notes (Signed)
Based on what you shared with me, I feel your condition warrants further evaluation and I recommend that you be seen for a face to face office visit.  NOTE: If you entered your credit card information for this eVisit, you will not be charged. You may see a "hold" on your card for the $35 but that hold will drop off and you will not have a charge processed.  If you are having a true medical emergency please call 911.     For an urgent face to face visit, [facility] has five urgent care centers for your convenience:    [email or web address] to reserve your spot online an avoid wait times  [facility] [address], [address] [place], [place] [address] Modified hours of operation: Monday-Friday, 12 PM to 6 PM  Closed Saturday & Sunday  *Across the street from [employer]  [facility] (New Address[facility] [address] [place], [place] [address] *Just off [address], across the road from [employer]* Modified hours of operation: Monday-Friday, 12 PM to 6 PM  Closed Saturday & Sunday   The following sites will take your insurance:  . [facility]    [phone]                  Get Driving Directions  [address] [place], [place] [address] . 10 am to 8 pm Monday-Friday . 12 pm to 8 pm Saturday-Sunday   . [facility]  [phone]                  Get Driving Directions  [address] [place], [place] [address] . 8 am to 8 pm Monday-Friday . 9 am to 6 pm Saturday . 11 am to 6 pm Sunday   . [facility]  [phone]                  Get Driving Directions   [address] [place], [place] [address] . 8 am to 8 pm Monday-Friday . 8 am to 4 pm Saturday-Sunday    . [facility] [place]                    Get Driving Directions  [phone]  [address] [place], [place] [address]   . Monday-Friday, 12 PM to 6 PM    Your e-visit answers were reviewed by a board certified advanced clinical practitioner to complete your personal care plan.  Thank you for using e-Visits. 

## 2019-06-06 ENCOUNTER — Telehealth: Payer: Self-pay | Admitting: Physician Assistant

## 2019-06-06 ENCOUNTER — Encounter: Payer: Self-pay | Admitting: Physician Assistant

## 2019-06-06 DIAGNOSIS — M549 Dorsalgia, unspecified: Secondary | ICD-10-CM

## 2019-06-06 MED ORDER — CYCLOBENZAPRINE HCL 10 MG PO TABS: 10.0000 mg | ORAL_TABLET | Freq: Three times a day (TID) | ORAL | 0 refills | Status: DC | PRN

## 2019-06-06 MED ORDER — NAPROXEN 500 MG PO TABS: 500.0000 mg | ORAL_TABLET | Freq: Two times a day (BID) | ORAL | 0 refills | Status: DC

## 2019-06-06 NOTE — Progress Notes (Signed)
We are sorry that you are not feeling well.  Here is how we plan to help!  Based on what you have shared with me it looks like you mostly have acute back pain.  Acute back pain is defined as musculoskeletal pain that can resolve in 1-3 weeks with conservative treatment.  Ms. Margaret Moon,  As per our conversation, you have indicated that you back pain is in the mid back to the right ( you stated you chose upper back as you did not midback as an option).You indicated pain "feels like muscle spasm" that you previously had when you played sports in the past. You have denied chest pain, shortness of breath, fever, chills, rash on back.  I have prescribed Naprosyn 500 mg twice a day non-steroid anti-inflammatory (NSAID) as well as Flexeril 10 mg every eight hours as needed which is a muscle relaxer  Some patients experience stomach irritation or in increased heartburn with anti-inflammatory drugs.  Please keep in mind that muscle relaxer's can cause fatigue and should not be taken while at work or driving.  Back pain is very common.  The pain often gets better over time.  The cause of back pain is usually not dangerous.  Most people can learn to manage their back pain on their own.  Home Care  Stay active.  Start with short walks on flat ground if you can.  Try to walk farther each day.  Do not sit, drive or stand in one place for more than 30 minutes.  Do not stay in bed.  Do not avoid exercise or work.  Activity can help your back heal faster.  Be careful when you bend or lift an object.  Bend at your knees, keep the object close to you, and do not twist.  Sleep on a firm mattress.  Lie on your side, and bend your knees.  If you lie on your back, put a pillow under your knees.  Only take medicines as told by your doctor.  Put ice on the injured area.  Put ice in a plastic bag  Place a towel between your skin and the bag  Leave the ice on for 15-20 minutes, 3-4 times a day for the first 2-3  days. 210 After that, you can switch between ice and heat packs.  Ask your doctor about back exercises or massage.  Avoid feeling anxious or stressed.  Find good ways to deal with stress, such as exercise.  Get Help Right Way If:  Your pain does not go away with rest or medicine.  Your pain does not go away in 1 week.  You have new problems.  You do not feel well.  The pain spreads into your legs.  You cannot control when you poop (bowel movement) or pee (urinate)  You feel sick to your stomach (nauseous) or throw up (vomit)  You have belly (abdominal) pain.  You feel like you may pass out (faint).  If you develop a fever.  Make Sure you:  Understand these instructions.  Will watch your condition  Will get help right away if you are not doing well or get worse.  Your e-visit answers were reviewed by a board certified advanced clinical practitioner to complete your personal care plan.  Depending on the condition, your plan could have included both over the counter or prescription medications.  If there is a problem please reply  once you have received a response from your provider.  Your safety is important to  [place].  If you have drug allergies check your prescription carefully.    You can use MyChart to ask questions about today's visit, request a non-urgent call back, or ask for a work or school excuse for 24 hours related to this e-Visit. If it has been greater than 24 hours you will need to follow up with your provider, or enter a new e-Visit to address those concerns.  You will get an e-mail in the next two days asking about your experience.  I hope that your e-visit has been valuable and will speed your recovery. Thank you for using e-visits.  I spent 5-10 minutes on review and completion of this note- Lacy Duverney [facility]

## 2019-08-08 ENCOUNTER — Other Ambulatory Visit: Payer: Self-pay

## 2019-08-08 ENCOUNTER — Emergency Department (HOSPITAL_COMMUNITY)
Admission: EM | Admit: 2019-08-08 | Discharge: 2019-08-08 | Disposition: A | Discharge status: Home / Self Care | Payer: Medicaid - Out of State | Attending: Emergency Medicine | Admitting: Emergency Medicine

## 2019-08-08 ENCOUNTER — Encounter (HOSPITAL_COMMUNITY): Payer: Self-pay

## 2019-08-08 DIAGNOSIS — J029 Acute pharyngitis, unspecified: Secondary | ICD-10-CM | POA: Diagnosis not present

## 2019-08-08 DIAGNOSIS — Z79899 Other long term (current) drug therapy: Secondary | ICD-10-CM | POA: Insufficient documentation

## 2019-08-08 MED ORDER — AZITHROMYCIN 250 MG PO TABS: 250.0000 mg | ORAL_TABLET | Freq: Every day | ORAL | 0 refills | Status: DC

## 2019-08-08 NOTE — Discharge Instructions (Signed)
Please return for any problem.  Follow-up with your regular care provider as instructed. °

## 2019-08-08 NOTE — ED Provider Notes (Signed)
[facility]-EMERGENCY DEPT Provider Note   CSN: [id number] Arrival date & time: 08/08/19  0847     History   Chief Complaint Chief Complaint  Patient presents with  . Sore Throat    HPI Margaret Moon is a 23 y.o. female.     23 year old female with prior medical history as detailed below presents for evaluation of her sore throat.  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  She reports gradual onset of sore throat over the last 36 hours.  She denies associated fever.  She does report pain with swallowing.  She does report white spots on her tonsils.  She denies known exposure to known sick contacts.  She denies associated shortness of breath, cough, congestion, or other acute complaint.  She repeatedly assures me that she is not pregnant.  The history is provided by the patient and medical records.  Sore Throat This is a new problem. The current episode started 2 days ago. The problem occurs constantly. The problem has not changed since onset.Pertinent negatives include no chest pain, no abdominal pain, no headaches and no shortness of breath. Nothing aggravates the symptoms. Nothing relieves the symptoms.    History reviewed. No pertinent past medical history.  There are no active problems to display for this patient.   History reviewed. No pertinent surgical history.   OB History   No obstetric history on file.      Home Medications    Prior to Admission medications   Medication Sig Start Date End Date Taking? Authorizing Provider  amoxicillin (AMOXIL) 500 MG capsule Take 1 capsule (500 mg total) by mouth 3 (three) times daily. 07/19/18   Roxy Horseman, PA-C  azithromycin (ZITHROMAX) 250 MG tablet Take 1 tablet (250 mg total) by mouth daily. Take first 2 tablets together, then 1 every day until finished. 08/08/19   Wynetta Fines, MD  benzonatate (TESSALON) 100 MG capsule Take 1 capsule (100 mg total) by mouth every 8 (eight)  hours. 03/09/18   Cathie Hoops, Amy V, PA-C  cetirizine (ZYRTEC) 10 MG tablet Take 1 tablet (10 mg total) by mouth daily. 03/09/18   Cathie Hoops, Amy V, PA-C  cyclobenzaprine (FLEXERIL) 10 MG tablet Take 1 tablet (10 mg total) by mouth 3 (three) times daily as needed for muscle spasms. 06/06/19   Demetrio Lapping, PA-C  dicyclomine (BENTYL) 20 MG tablet Take 1 tablet (20 mg total) by mouth 2 (two) times daily. 03/09/18   Cathie Hoops, Amy V, PA-C  ipratropium (ATROVENT) 0.06 % nasal spray Place 2 sprays into both nostrils 4 (four) times daily. 03/09/18   Cathie Hoops, Amy V, PA-C  naproxen (NAPROSYN) 500 MG tablet Take 1 tablet (500 mg total) by mouth 2 (two) times daily. 08/18/15   Linwood Dibbles, MD  naproxen (NAPROSYN) 500 MG tablet Take 1 tablet (500 mg total) by mouth 2 (two) times daily with a meal. 06/06/19   Illa Level M, PA-C  ondansetron (ZOFRAN ODT) 4 MG disintegrating tablet Take 1 tablet (4 mg total) by mouth every 8 (eight) hours as needed for nausea or vomiting. 03/09/18   Belinda Fisher, PA-C    Family History Family History  Problem Relation Age of Onset  . Healthy Mother   . Healthy Father     Social History Social History   Tobacco Use  . Smoking status: Never Smoker  . Smokeless tobacco: Never Used  Substance Use Topics  . Alcohol use: No  . Drug use: Yes  Types: Marijuana     Allergies   Patient has no known allergies.   Review of Systems Review of Systems  Respiratory: Negative for shortness of breath.   Cardiovascular: Negative for chest pain.  Gastrointestinal: Negative for abdominal pain.  Neurological: Negative for headaches.  All other systems reviewed and are negative.    Physical Exam Updated Vital Signs BP 134/87 (BP Location: Right Arm)   Pulse 94   Temp 99.4 F (37.4 C) (Oral)   Resp 16   Ht 5\' 3"  (1.6 m)   Wt 59 kg   LMP 07/08/2019 (Approximate)   SpO2 99%   BMI 23.03 kg/m   Physical Exam Vitals signs and nursing note reviewed.  Constitutional:      General: She is not in acute  distress.    Appearance: She is well-developed.  HENT:     Head: Normocephalic and atraumatic.     Mouth/Throat:     Mouth: Mucous membranes are moist.     Pharynx: Oropharyngeal exudate and posterior oropharyngeal erythema present. No uvula swelling.     Comments: Mild posterior pharyngeal erythema with minimal exudates on tonsils.  Uvula midline  Normal phonation  No stridor Eyes:     Conjunctiva/sclera: Conjunctivae normal.     Pupils: Pupils are equal, round, and reactive to light.  Neck:     Musculoskeletal: Normal range of motion and neck supple.  Cardiovascular:     Rate and Rhythm: Normal rate and regular rhythm.     Heart sounds: Normal heart sounds.  Pulmonary:     Effort: Pulmonary effort is normal. No respiratory distress.     Breath sounds: Normal breath sounds.  Abdominal:     General: There is no distension.     Palpations: Abdomen is soft.     Tenderness: There is no abdominal tenderness.  Musculoskeletal: Normal range of motion.        General: No deformity.  Skin:    General: Skin is warm and dry.  Neurological:     Mental Status: She is alert and oriented to person, place, and time.      ED Treatments / Results  Labs (all labs ordered are listed, but only abnormal results are displayed) Labs Reviewed - No data to display  EKG None  Radiology No results found.  Procedures Procedures (including critical care time)  Medications Ordered in ED Medications - No data to display   Initial Impression / Assessment and Plan / ED Course  I have reviewed the triage vital signs and the nursing notes.  Pertinent labs & imaging results that were available during my care of the patient were reviewed by me and considered in my medical decision making (see chart for details).        MDM  Screen complete  Margaret Moon was evaluated in Emergency Department on 08/08/2019 for the symptoms described in the history of present illness. She was  evaluated in the context of the global COVID-19 pandemic, which necessitated consideration that the patient might be at risk for infection with the SARS-CoV-2 virus that causes COVID-19. Institutional protocols and algorithms that pertain to the evaluation of patients at risk for COVID-19 are in a state of rapid change based on information released by regulatory bodies including the CDC and federal and state organizations. These policies and algorithms were followed during the patient's care in the ED.  Patient is presenting for evaluation of sore throat. Her presentation is consistent with likely strep throat (vs viral pharyngitis).  She is requesting course of antibiotics. No evidence of PTA or other significant pathology on exam.   She declines strep testing at this time.  Patient appears to be appropriate discharge.  She is advised to follow-up closely with her regular care provider.  Strict return precautions given and understood.   Final Clinical Impressions(s) / ED Diagnoses   Final diagnoses:  Acute pharyngitis, unspecified etiology    ED Discharge Orders         Ordered    azithromycin (ZITHROMAX) 250 MG tablet  Daily     08/08/19 0942           Wynetta FinesMessick, Peter C, MD 08/08/19 [phone]

## 2019-08-08 NOTE — ED Triage Notes (Signed)
Patient c/o sore throat since last night.  Patient reports a history of strep throat. Patient also c/o muscle aching,but states that she usually has this when her period is about to start.

## 2020-06-20 ENCOUNTER — Emergency Department: Payer: Medicaid - Out of State

## 2020-06-20 ENCOUNTER — Emergency Department
Admission: EM | Admit: 2020-06-20 | Discharge: 2020-06-20 | Disposition: A | Discharge status: Home / Self Care | Payer: Medicaid - Out of State | Attending: Emergency Medicine | Admitting: Emergency Medicine

## 2020-06-20 ENCOUNTER — Other Ambulatory Visit: Payer: Self-pay

## 2020-06-20 DIAGNOSIS — R112 Nausea with vomiting, unspecified: Secondary | ICD-10-CM

## 2020-06-20 DIAGNOSIS — R102 Pelvic and perineal pain: Secondary | ICD-10-CM | POA: Diagnosis not present

## 2020-06-20 LAB — URINALYSIS, COMPLETE (UACMP) WITH MICROSCOPIC
Bilirubin Urine: NEGATIVE
Glucose, UA: NEGATIVE mg/dL
Ketones, ur: 80 mg/dL — AB
Leukocytes,Ua: NEGATIVE
Nitrite: NEGATIVE
Protein, ur: 100 mg/dL — AB
Specific Gravity, Urine: 1.034 — ABNORMAL HIGH (ref 1.005–1.030)
Squamous Epithelial / HPF: NONE SEEN (ref 0–5)
pH: 5 (ref 5.0–8.0)

## 2020-06-20 LAB — COMPREHENSIVE METABOLIC PANEL
ALT: 15 U/L (ref 0–44)
AST: 25 U/L (ref 15–41)
Albumin: 4.9 g/dL (ref 3.5–5.0)
Alkaline Phosphatase: 36 U/L — ABNORMAL LOW (ref 38–126)
Anion gap: 9 (ref 5–15)
BUN: 14 mg/dL (ref 6–20)
CO2: 24 mmol/L (ref 22–32)
Calcium: 9.3 mg/dL (ref 8.9–10.3)
Chloride: 104 mmol/L (ref 98–111)
Creatinine, Ser: 0.82 mg/dL (ref 0.44–1.00)
GFR calc Af Amer: >60 mL/min (ref >60)
GFR calc non Af Amer: >60 mL/min (ref >60)
Glucose, Bld: 125 mg/dL — ABNORMAL HIGH (ref 70–99)
Potassium: 3.8 mmol/L (ref 3.5–5.1)
Sodium: 137 mmol/L (ref 135–145)
Total Bilirubin: 0.6 mg/dL (ref 0.3–1.2)
Total Protein: 8.5 g/dL — ABNORMAL HIGH (ref 6.5–8.1)

## 2020-06-20 LAB — CBC
HCT: 33.2 % — ABNORMAL LOW (ref 36.0–46.0)
Hemoglobin: 11.3 g/dL — ABNORMAL LOW (ref 12.0–15.0)
MCH: 28.5 pg (ref 26.0–34.0)
MCHC: 34 g/dL (ref 30.0–36.0)
MCV: 83.8 fL (ref 80.0–100.0)
Platelets: 184 10*3/uL (ref 150–400)
RBC: 3.96 MIL/uL (ref 3.87–5.11)
RDW: 14.4 % (ref 11.5–15.5)
WBC: 6.4 10*3/uL (ref 4.0–10.5)
nRBC: 0 % (ref 0.0–0.2)

## 2020-06-20 LAB — PREGNANCY, URINE: Preg Test, Ur: NEGATIVE

## 2020-06-20 LAB — LIPASE, BLOOD: Lipase: 26 U/L (ref 11–51)

## 2020-06-20 MED ORDER — PROMETHAZINE HCL 25 MG PO TABS
12.5000 mg | ORAL_TABLET | Freq: Once | ORAL | Status: AC
  Administered 2020-06-20: 12.5 mg via ORAL
  Filled 2020-06-20: qty 1

## 2020-06-20 MED ORDER — PROMETHAZINE HCL 12.5 MG PO TABS: 12.5000 mg | ORAL_TABLET | Freq: Four times a day (QID) | ORAL | 0 refills | Status: DC | PRN

## 2020-06-20 MED ORDER — ONDANSETRON HCL 4 MG/2ML IJ SOLN
4.0000 mg | Freq: Once | INTRAMUSCULAR | Status: AC
  Administered 2020-06-20: 4 mg via INTRAVENOUS
  Filled 2020-06-20: qty 2

## 2020-06-20 MED ORDER — SODIUM CHLORIDE 0.9 % IV BOLUS
1000.0000 mL | Freq: Once | INTRAVENOUS | Status: AC
  Administered 2020-06-20: 1000 mL via INTRAVENOUS

## 2020-06-20 NOTE — ED Notes (Signed)
Pt updated in WR, VS reassessed °

## 2020-06-20 NOTE — ED Notes (Signed)
See triage note, pt reports dry heaving since last night, unable to keep water down. Headache, abdominal pain and flank pain. Denies urinary sx. Reports fever last night with max of 100. Reports headache and overall malaise.  RR even and unlabored. Pt in NAD

## 2020-06-20 NOTE — ED Provider Notes (Signed)
Emergency Department Provider Note  ____________________________________________  Time seen: Approximately 3:16 PM  I have reviewed the triage vital signs and the nursing notes.   HISTORY  Chief Complaint Abdominal Pain and Generalized Body Aches   Historian Patient     HPI Margaret Moon is a 24 y.o. female presents to the emergency department with bilateral pelvic pain and emesis that started last night.  Patient states that she went to [employer] yesterday with her girlfriend.  She states that she had pasta and felt fine afterwards.  Patient states that she went to work later that night and started feeling nauseated.  She stated that she associated her symptoms with the onset of her menses.  She states that after she left work and through the night, she experienced multiple episodes of emesis.  She states that this is atypical for her menstrual cycle.  She denies fever and chills.  She had one episode of diarrhea.  States that she has never experienced similar symptoms in the past.  Denies a history of ovarian cyst or ovarian torsion.   History reviewed. Moon pertinent past medical history.   Immunizations up to date:  Yes.     History reviewed. Moon pertinent past medical history.  There are Moon problems to display for this patient.   History reviewed. Moon pertinent surgical history.  Prior to Admission medications   Medication Sig Start Date End Date Taking? Authorizing Provider  amoxicillin (AMOXIL) 500 MG capsule Take 1 capsule (500 mg total) by mouth 3 (three) times daily. 07/19/18   Roxy Horseman, PA-C  azithromycin (ZITHROMAX) 250 MG tablet Take 1 tablet (250 mg total) by mouth daily. Take first 2 tablets together, then 1 every day until finished. 08/08/19   Wynetta Fines, MD  benzonatate (TESSALON) 100 MG capsule Take 1 capsule (100 mg total) by mouth every 8 (eight) hours. 03/09/18   Cathie Hoops, Amy V, PA-C  cetirizine (ZYRTEC) 10 MG tablet Take 1 tablet (10 mg total)  by mouth daily. 03/09/18   Cathie Hoops, Amy V, PA-C  cyclobenzaprine (FLEXERIL) 10 MG tablet Take 1 tablet (10 mg total) by mouth 3 (three) times daily as needed for muscle spasms. 06/06/19   Demetrio Lapping, PA-C  dicyclomine (BENTYL) 20 MG tablet Take 1 tablet (20 mg total) by mouth 2 (two) times daily. 03/09/18   Cathie Hoops, Amy V, PA-C  ipratropium (ATROVENT) 0.06 % nasal spray Place 2 sprays into both nostrils 4 (four) times daily. 03/09/18   Cathie Hoops, Amy V, PA-C  naproxen (NAPROSYN) 500 MG tablet Take 1 tablet (500 mg total) by mouth 2 (two) times daily. 08/18/15   Linwood Dibbles, MD  naproxen (NAPROSYN) 500 MG tablet Take 1 tablet (500 mg total) by mouth 2 (two) times daily with a meal. 06/06/19   Illa Level M, PA-C  ondansetron (ZOFRAN ODT) 4 MG disintegrating tablet Take 1 tablet (4 mg total) by mouth every 8 (eight) hours as needed for nausea or vomiting. 03/09/18   Belinda Fisher, PA-C    Allergies Patient has Moon known allergies.  Family History  Problem Relation Age of Onset  . Healthy Mother   . Healthy Father     Social History Social History   Tobacco Use  . Smoking status: Never Smoker  . Smokeless tobacco: Never Used  Vaping Use  . Vaping Use: Never used  Substance Use Topics  . Alcohol use: Moon  . Drug use: Yes    Types: Marijuana     Review of  Systems  Constitutional: Moon fever/chills Eyes:  Moon discharge ENT: Moon upper respiratory complaints. Respiratory: Moon cough. Moon SOB/ use of accessory muscles to breath Gastrointestinal: Patient has emesis.  Genitourinary: Patient has pelvic pain.  Musculoskeletal: Negative for musculoskeletal pain. Skin: Negative for rash, abrasions, lacerations, ecchymosis.   ____________________________________________   PHYSICAL EXAM:  VITAL SIGNS: ED Triage Vitals  Enc Vitals Group     BP 06/20/20 0939 130/63     Pulse Rate 06/20/20 0939 (!) 58     Resp 06/20/20 0939 16     Temp 06/20/20 0939 98.7 F (37.1 C)     Temp Source 06/20/20 0939 Oral     SpO2  06/20/20 0939 98 %     Weight 06/20/20 0939 141 lb (64 kg)     Height 06/20/20 0939 5\' 3"  (1.6 m)     Head Circumference --      Peak Flow --      Pain Score 06/20/20 0941 9     Pain Loc --      Pain Edu? --      Excl. in GC? --      Constitutional: Alert and oriented. Well appearing and in Moon acute distress. Eyes: Conjunctivae are normal. PERRL. EOMI. Head: Atraumatic. Cardiovascular: Normal rate, regular rhythm. Normal S1 and S2.  Good peripheral circulation. Respiratory: Normal respiratory effort without tachypnea or retractions. Lungs CTAB. Good air entry to the bases with Moon decreased or absent breath sounds Gastrointestinal: Bowel sounds x 4 quadrants. Soft and nontender to palpation. Moon guarding or rigidity. Moon distention. Musculoskeletal: Full range of motion to all extremities. Moon obvious deformities noted Neurologic:  Normal for age. Moon gross focal neurologic deficits are appreciated.  Skin:  Skin is warm, dry and intact. Moon rash noted. Psychiatric: Mood and affect are normal for age. Speech and behavior are normal.   ____________________________________________   LABS (all labs ordered are listed, but only abnormal results are displayed)  Labs Reviewed  COMPREHENSIVE METABOLIC PANEL - Abnormal; Notable for the following components:      Result Value   Glucose, Bld 125 (*)    Total Protein 8.5 (*)    Alkaline Phosphatase 36 (*)    All other components within normal limits  CBC - Abnormal; Notable for the following components:   Hemoglobin 11.3 (*)    HCT 33.2 (*)    All other components within normal limits  URINALYSIS, COMPLETE (UACMP) WITH MICROSCOPIC - Abnormal; Notable for the following components:   Color, Urine YELLOW (*)    APPearance TURBID (*)    Specific Gravity, Urine 1.034 (*)    Hgb urine dipstick LARGE (*)    Ketones, ur 80 (*)    Protein, ur 100 (*)    Bacteria, UA RARE (*)    All other components within normal limits  LIPASE, BLOOD   PREGNANCY, URINE   ____________________________________________  EKG   ____________________________________________  RADIOLOGY 06/22/20, personally viewed and evaluated these images (plain radiographs) as part of my medical decision making, as well as reviewing the written report by the radiologist.  Moon results found.  ____________________________________________    PROCEDURES  Procedure(s) performed:     Procedures     Medications  promethazine (PHENERGAN) tablet 12.5 mg (has Moon administration in time range)     ____________________________________________   INITIAL IMPRESSION / ASSESSMENT AND PLAN / ED COURSE  Pertinent labs & imaging results that were available during my care of the patient were reviewed by  me and considered in my medical decision making (see chart for details).  Clinical Course as of Jun 20 1520  Wed Jun 20, 2020  1518 Chloride: 104 [JW]    Clinical Course User Index [JW] Orvil Feil, PA-C   Assessment and plan Pelvic pain Emesis 24 year old female presents to the emergency department with emesis and pelvic pain 1 day after starting her menstrual cycle.  Patient had low-grade fever and was bradycardic at triage.  She was resting comfortably in exam room.  CBC and CMP were reassuring.  Urinalysis revealed a large amount of blood which was consistent with patient's menstrual cycle.  Lipase was within reference range.  Will obtain pelvic ultrasound to rule out ovarian cyst, hemorrhagic ovarian cyst and ovarian torsion.  Pelvic ultrasound revealed Moon evidence of ovarian cyst, hemorrhagic ovarian cyst or ovarian torsion.  Patient passed a p.o. challenge after Phenergan was given.  She was discharged with a short course of Phenergan.  Return precautions were given to return with new or worsening symptoms.    ____________________________________________  FINAL CLINICAL IMPRESSION(S) / ED DIAGNOSES  Final diagnoses:   Pelvic pain      NEW MEDICATIONS STARTED DURING THIS VISIT:  ED Discharge Orders    None          This chart was dictated using voice recognition software/Dragon. Despite best efforts to proofread, errors can occur which can change the meaning. Any change was purely unintentional.     Orvil Feil, PA-C 06/20/20 1931    Phineas Semen, MD 06/20/20 [phone]

## 2020-06-20 NOTE — Discharge Instructions (Signed)
Take Phenergan every 6 hours for nausea. Tylenol and ibuprofen alternating can be taken for menstrual cramps.

## 2020-06-20 NOTE — ED Triage Notes (Signed)
Reports body aches, abdominal pain, dry heaving since last night. Denies cough, congestion, or any urinary sx. Pt alert and oriented X4, cooperative, RR even and unlabored, color WNL. Pt in NAD.

## 2020-06-21 LAB — POCT PREGNANCY, URINE: Preg Test, Ur: NEGATIVE

## 2021-03-21 ENCOUNTER — Encounter (HOSPITAL_COMMUNITY): Payer: Self-pay | Admitting: Emergency Medicine

## 2021-03-21 ENCOUNTER — Other Ambulatory Visit: Payer: Self-pay

## 2021-03-21 ENCOUNTER — Emergency Department (HOSPITAL_COMMUNITY): Payer: Medicaid - Out of State

## 2021-03-21 ENCOUNTER — Emergency Department (HOSPITAL_COMMUNITY)
Admission: EM | Admit: 2021-03-21 | Discharge: 2021-03-21 | Disposition: A | Discharge status: Home / Self Care | Payer: Medicaid - Out of State | Attending: Emergency Medicine | Admitting: Emergency Medicine

## 2021-03-21 DIAGNOSIS — R55 Syncope and collapse: Secondary | ICD-10-CM | POA: Diagnosis not present

## 2021-03-21 DIAGNOSIS — W228XXA Striking against or struck by other objects, initial encounter: Secondary | ICD-10-CM | POA: Insufficient documentation

## 2021-03-21 DIAGNOSIS — Y990 Civilian activity done for income or pay: Secondary | ICD-10-CM | POA: Diagnosis not present

## 2021-03-21 DIAGNOSIS — M542 Cervicalgia: Secondary | ICD-10-CM | POA: Insufficient documentation

## 2021-03-21 DIAGNOSIS — S0003XA Contusion of scalp, initial encounter: Secondary | ICD-10-CM | POA: Diagnosis not present

## 2021-03-21 DIAGNOSIS — S0990XA Unspecified injury of head, initial encounter: Secondary | ICD-10-CM | POA: Diagnosis present

## 2021-03-21 LAB — CBC WITH DIFFERENTIAL/PLATELET
Abs Immature Granulocytes: 0.01 10*3/uL (ref 0.00–0.07)
Basophils Absolute: 0.1 10*3/uL (ref 0.0–0.1)
Basophils Relative: 1 %
Eosinophils Absolute: 0.3 10*3/uL (ref 0.0–0.5)
Eosinophils Relative: 5 %
HCT: 35.9 % — ABNORMAL LOW (ref 36.0–46.0)
Hemoglobin: 12 g/dL (ref 12.0–15.0)
Immature Granulocytes: 0 %
Lymphocytes Relative: 24 %
Lymphs Abs: 1.2 10*3/uL (ref 0.7–4.0)
MCH: 29.9 pg (ref 26.0–34.0)
MCHC: 33.4 g/dL (ref 30.0–36.0)
MCV: 89.5 fL (ref 80.0–100.0)
Monocytes Absolute: 0.5 10*3/uL (ref 0.1–1.0)
Monocytes Relative: 10 %
Neutro Abs: 2.9 10*3/uL (ref 1.7–7.7)
Neutrophils Relative %: 60 %
Platelets: 174 10*3/uL (ref 150–400)
RBC: 4.01 MIL/uL (ref 3.87–5.11)
RDW: 14 % (ref 11.5–15.5)
WBC: 5 10*3/uL (ref 4.0–10.5)
nRBC: 0 % (ref 0.0–0.2)

## 2021-03-21 LAB — COMPREHENSIVE METABOLIC PANEL
ALT: 12 U/L (ref 0–44)
AST: 22 U/L (ref 15–41)
Albumin: 4.2 g/dL (ref 3.5–5.0)
Alkaline Phosphatase: 32 U/L — ABNORMAL LOW (ref 38–126)
Anion gap: 8 (ref 5–15)
BUN: 12 mg/dL (ref 6–20)
CO2: 22 mmol/L (ref 22–32)
Calcium: 8.9 mg/dL (ref 8.9–10.3)
Chloride: 109 mmol/L (ref 98–111)
Creatinine, Ser: 0.79 mg/dL (ref 0.44–1.00)
GFR, Estimated: >60 mL/min (ref >60)
Glucose, Bld: 78 mg/dL (ref 70–99)
Potassium: 4 mmol/L (ref 3.5–5.1)
Sodium: 139 mmol/L (ref 135–145)
Total Bilirubin: 0.4 mg/dL (ref 0.3–1.2)
Total Protein: 7.3 g/dL (ref 6.5–8.1)

## 2021-03-21 LAB — CBG MONITORING, ED: Glucose-Capillary: 78 mg/dL (ref 70–99)

## 2021-03-21 LAB — I-STAT BETA HCG BLOOD, ED (MC, WL, AP ONLY): I-stat hCG, quantitative: <5 m[IU]/mL (ref <5)

## 2021-03-21 MED ORDER — SODIUM CHLORIDE 0.9 % IV BOLUS
1000.0000 mL | Freq: Once | INTRAVENOUS | Status: AC
  Administered 2021-03-21: 1000 mL via INTRAVENOUS

## 2021-03-21 MED ORDER — LIDOCAINE 5 % EX PTCH: 1.0000 | MEDICATED_PATCH | CUTANEOUS | 0 refills | Status: DC

## 2021-03-21 MED ORDER — LIDOCAINE 5 % EX PTCH
1.0000 | MEDICATED_PATCH | CUTANEOUS | Status: DC
  Administered 2021-03-21: 1 via TRANSDERMAL
  Filled 2021-03-21: qty 1

## 2021-03-21 MED ORDER — IBUPROFEN 200 MG PO TABS
600.0000 mg | ORAL_TABLET | Freq: Once | ORAL | Status: AC
  Administered 2021-03-21: 600 mg via ORAL
  Filled 2021-03-21: qty 3

## 2021-03-21 NOTE — ED Triage Notes (Signed)
Pt arrived via EMS from work. Pt states she remembers getting very hot and then she lost consciousness. Pt was told by coworkers that she hit her head on the concrete. Pt reports feeling queasy yesterday and having some diarrhea after her dinner. Pt states she has been drinking pedialite, and that she had been feeling better until about 2315 on 03/20/2021

## 2021-03-21 NOTE — Discharge Instructions (Addendum)
Thank you for allowing me to care for you today in the Emergency Department.   You were seen today after you had a syncopal episode.  Your work-up in the ER was reassuring.  Call the number on your discharge paperwork to get established with a primary care provider for follow-up after ER visit today.  Take 650 mg of Tylenol or 600 mg of ibuprofen with food every 6 hours for pain.  You can alternate between these 2 medications every 3 hours if your pain returns.  For instance, you can take Tylenol at noon, followed by a dose of ibuprofen at 3, followed by second dose of Tylenol and 6.  You can also apply 1 lidocaine patch as directed to areas that are sore.  You can apply an ice pack for 15 to 20 minutes to your neck or to the back of your head.  The area of swelling on the back of your head should improve in the next few days.  Return to the emergency department if you develop severe chest pain, respiratory distress, if you pass out, develop new numbness, weakness, visual changes, develop uncontrollable vomiting, or have other new, concerning symptoms.

## 2021-03-21 NOTE — ED Provider Notes (Signed)
[facility]-EMERGENCY DEPT Provider Note   CSN: [id number] Arrival date & time: 03/21/21  0004     History Chief Complaint  Patient presents with  . Loss of Consciousness    Margaret Moon is a 25 y.o. female with history of anemia who presents the emergency department by EMS with a chief complaint of syncope.  The patient reports that she had been standing at work for several hours when she suddenly felt hot all over.  She had a witnessed syncopal episode and coworkers informed her that she hit her head.  She is now endorsing right-sided neck pain and a 6 out of 10 posterior headache.  She reports that yesterday she was concerned that she may have developed food poisoning as she had multiple episodes of nausea, vomiting, and diarrhea.  However, the symptoms had resolved by this morning.  She has been trying to hydrate with Pedialyte.  She denies feeling dizzy or lightheaded, chest pain, shortness of breath, visual changes, numbness, weakness, neck stiffness, fever, chills, abdominal pain, slurred speech, or facial droop.  No treatment prior to arrival.  States that she has a history of anemia secondary to heavy menstrual cycles.  Reports that her menstrual cycle is about ready to start.  No treatment prior to arrival.  Reports that she had one previous episode of syncope several years ago while she was at an outdoor event where she had been for several hours and suspects it was secondary to dehydration.  The history is provided by the patient and medical records. No language interpreter was used.       History reviewed. No pertinent past medical history.  There are no problems to display for this patient.   History reviewed. No pertinent surgical history.   OB History   No obstetric history on file.     Family History  Problem Relation Age of Onset  . Healthy Mother   . Healthy Father     Social History   Tobacco Use  . Smoking status: Never  Smoker  . Smokeless tobacco: Never Used  Vaping Use  . Vaping Use: Never used  Substance Use Topics  . Alcohol use: No  . Drug use: Yes    Types: Marijuana    Home Medications Prior to Admission medications   Medication Sig Start Date End Date Taking? Authorizing Provider  lidocaine (LIDODERM) 5 % Place 1 patch onto the skin daily. Remove & Discard patch within 12 hours or as directed by MD 03/21/21  Yes Kyliah Deanda A, PA-C    Allergies    Patient has no known allergies.  Review of Systems   Review of Systems  Constitutional: Negative for activity change, chills, diaphoresis and fever.  Respiratory: Negative for shortness of breath and wheezing.   Cardiovascular: Negative for chest pain and palpitations.  Gastrointestinal: Positive for diarrhea (resolved), nausea (resolved) and vomiting (resolved). Negative for abdominal pain and blood in stool.  Genitourinary: Negative for decreased urine volume, dysuria, urgency, vaginal bleeding and vaginal pain.  Musculoskeletal: Positive for neck pain. Negative for back pain.  Skin: Negative for rash.  Allergic/Immunologic: Negative for immunocompromised state.  Neurological: Positive for headaches. Negative for dizziness, seizures, syncope, speech difficulty, weakness and numbness.  Psychiatric/Behavioral: Negative for confusion.    Physical Exam Updated Vital Signs BP 98/61   Pulse 82   Temp 98.4 F (36.9 C) (Oral)   Resp 19   Ht 5\' 2"  (1.575 m)   Wt 50.8 kg   SpO2  100%   BMI 20.49 kg/m   Physical Exam Vitals and nursing note reviewed.  Constitutional:      General: She is not in acute distress.    Appearance: She is not ill-appearing, toxic-appearing or diaphoretic.  HENT:     Head: Normocephalic.     Comments: Hematoma noted to the occiput.  No overlying abrasions or lacerations.  No crepitus or step-offs noted to the skull.    Mouth/Throat:     Mouth: Mucous membranes are moist.  Eyes:     Extraocular Movements:  Extraocular movements intact.     Conjunctiva/sclera: Conjunctivae normal.     Pupils: Pupils are equal, round, and reactive to light.  Cardiovascular:     Rate and Rhythm: Normal rate and regular rhythm.     Pulses: Normal pulses.     Heart sounds: Normal heart sounds. No murmur heard. No friction rub. No gallop.   Pulmonary:     Effort: Pulmonary effort is normal. No respiratory distress.     Breath sounds: No stridor. No wheezing, rhonchi or rales.  Chest:     Chest wall: No tenderness.  Abdominal:     General: There is no distension.     Palpations: Abdomen is soft. There is no mass.     Tenderness: There is no abdominal tenderness. There is no right CVA tenderness, left CVA tenderness, guarding or rebound.     Hernia: No hernia is present.  Musculoskeletal:     Cervical back: Neck supple.     Right lower leg: No edema.     Left lower leg: No edema.  Skin:    General: Skin is warm.     Capillary Refill: Capillary refill takes less than 2 seconds.     Coloration: Skin is not jaundiced or pale.     Findings: No rash.  Neurological:     Mental Status: She is alert.     Comments: Cranial nerves II through XII are grossly intact.  5 out of 5 strength against resistance of the bilateral upper and lower extremities.  Sensation is intact and equal throughout.  Finger-to-nose is intact bilaterally.  No pronator drift.  Gait exam deferred at this time.  Psychiatric:        Behavior: Behavior normal.     ED Results / Procedures / Treatments   Labs (all labs ordered are listed, but only abnormal results are displayed) Labs Reviewed  CBC WITH DIFFERENTIAL/PLATELET - Abnormal; Notable for the following components:      Result Value   HCT 35.9 (*)    All other components within normal limits  COMPREHENSIVE METABOLIC PANEL - Abnormal; Notable for the following components:   Alkaline Phosphatase 32 (*)    All other components within normal limits  CBG MONITORING, ED  CBG MONITORING,  ED  I-STAT BETA HCG BLOOD, ED ([facility], [facility], [facility] ONLY)    EKG None  Radiology CT Head Wo Contrast  Result Date: 03/21/2021 CLINICAL DATA:  Fall, head injury, loss of consciousness EXAM: CT HEAD WITHOUT CONTRAST TECHNIQUE: Contiguous axial images were obtained from the base of the skull through the vertex without intravenous contrast. COMPARISON:  08/18/2015 FINDINGS: Brain: Normal anatomic configuration. No abnormal intra or extra-axial mass lesion or fluid collection. No abnormal mass effect or midline shift. No evidence of acute intracranial hemorrhage or infarct. Ventricular size is normal. Cerebellum unremarkable. Vascular: Unremarkable Skull: Intact Sinuses/Orbits: Paranasal sinuses are clear. Orbits are unremarkable. Other: Mastoid air cells and middle ear cavities are  clear. IMPRESSION: No acute intracranial injury.  No calvarial fracture. Electronically Signed   By: Helyn Numbers MD   On: 03/21/2021 02:41    Procedures Procedures   Medications Ordered in ED Medications  lidocaine (LIDODERM) 5 % 1 patch (1 patch Transdermal Patch Applied 03/21/21 0501)  sodium chloride 0.9 % bolus 1,000 mL (0 mLs Intravenous Stopped 03/21/21 0453)  ibuprofen (ADVIL) tablet 600 mg (600 mg Oral Given 03/21/21 0500)    ED Course  I have reviewed the triage vital signs and the nursing notes.  Pertinent labs & imaging results that were available during my care of the patient were reviewed by me and considered in my medical decision making (see chart for details).    MDM Rules/Calculators/A&P                          25 year old female with history of anemia who presents the emergency department by EMS with a chief complaint of witnessed syncopal preceded by prodromal symptoms.  Patient also notes that she was having nausea, vomiting, and diarrhea yesterday, but the symptoms resolved today.  Vital signs are normal.  Labs and imaging have been reviewed and independently interpreted by me.  No metabolic  derangements.  Hemoglobin is normal.  CBC is otherwise unremarkable.  CT head is unremarkable.  CT cervical spine not obtained as patient has no midline tenderness and exam is more consistent with musculoskeletal strain.  On reevaluation, patient has been successfully fluid challenge.  She had negative orthostatic vital signs.  She was ambulated and was asymptomatic.  Reports that she is feeling much improved and is ready for discharge home.  Given that she had prodromal symptoms, suspect vasovagal syncope.  Less likely orthostatic hypotension, cardiac arrhythmia, CVA, intracranial hemorrhage, dehydration.  At this time, the patient is hemodynamically stable no acute distress.  Safe for discharge home with outpatient follow-up as needed.  Patient has been given resources to get established with a PCP.  Final Clinical Impression(s) / ED Diagnoses Final diagnoses:  Vasovagal syncope  Hematoma of scalp, initial encounter  Neck pain on right side    Rx / DC Orders ED Discharge Orders         Ordered    lidocaine (LIDODERM) 5 %  Every 24 hours        03/21/21 0453           Barkley Boards, PA-C 03/21/21 0551    Nira Conn, MD 03/22/21 [phone]

## 2022-01-29 ENCOUNTER — Other Ambulatory Visit: Payer: Self-pay | Admitting: *Deleted

## 2022-01-29 DIAGNOSIS — N6310 Unspecified lump in the right breast, unspecified quadrant: Secondary | ICD-10-CM

## 2022-02-18 ENCOUNTER — Ambulatory Visit
Admission: RE | Admit: 2022-02-18 | Discharge: 2022-02-18 | Disposition: A | Discharge status: Home / Self Care | Payer: 59 | Source: Ambulatory Visit | Attending: *Deleted | Admitting: *Deleted

## 2022-02-18 DIAGNOSIS — N6310 Unspecified lump in the right breast, unspecified quadrant: Secondary | ICD-10-CM

## 2022-04-23 ENCOUNTER — Encounter (HOSPITAL_COMMUNITY): Payer: Self-pay

## 2022-04-23 ENCOUNTER — Emergency Department (HOSPITAL_COMMUNITY): Payer: 59

## 2022-04-23 ENCOUNTER — Emergency Department (HOSPITAL_COMMUNITY)
Admission: EM | Admit: 2022-04-23 | Discharge: 2022-04-23 | Disposition: A | Discharge status: Home / Self Care | Payer: 59 | Attending: Emergency Medicine | Admitting: Emergency Medicine

## 2022-04-23 DIAGNOSIS — S4992XA Unspecified injury of left shoulder and upper arm, initial encounter: Secondary | ICD-10-CM | POA: Insufficient documentation

## 2022-04-23 DIAGNOSIS — W19XXXA Unspecified fall, initial encounter: Secondary | ICD-10-CM

## 2022-04-23 DIAGNOSIS — M25512 Pain in left shoulder: Secondary | ICD-10-CM | POA: Insufficient documentation

## 2022-04-23 MED ORDER — NAPROXEN 500 MG PO TABS: 500.0000 mg | ORAL_TABLET | Freq: Two times a day (BID) | ORAL | 0 refills | Status: DC

## 2022-04-23 NOTE — Discharge Instructions (Signed)
It was a pleasure taking care of you here in the emergency department.  Your x-ray here today did not show any evidence of broken bones ? ?Take Tylenol, Motrin as needed for your pain.  Keep the splint on over the next 24 hours.  After that you may use gentle stretching exercises.  If you continue to have decreased range of motion or increase in pain follow with orthopedics.  There is one listed in your discharge paperwork if you do not have 1 already ? ?Return for new or worsening symptoms ?

## 2022-04-23 NOTE — ED Provider Notes (Signed)
[facility]-EMERGENCY DEPT Provider Note   CSN: [id number] Arrival date & time: 04/23/22  1235     History  Chief Complaint  Patient presents with   Shoulder Injury    Margaret Moon is a 26 y.o. female here for evaluation of left shoulder pain began yesterday evening.  Patient was sitting in a car when she saw a spider.  Subsequently jumped out of the car.  The car was not moving.  She landed to the lateral superior aspect of her left shoulder.  She denies hitting her head, LOC or anticoagulation.  No numbness or weakness.  Has not taken any medications today for the pain.  Pain worse with overhead movement.  No pain to scapula, clavicle.  No fever, swelling, redness, warmth, contusions or abrasions.  Denies chance of preg, LMP 4 days ago  HPI     Home Medications Prior to Admission medications   Medication Sig Start Date End Date Taking? Authorizing Provider  naproxen (NAPROSYN) 500 MG tablet Take 1 tablet (500 mg total) by mouth 2 (two) times daily. 04/23/22  Yes Kennadi Albany A, PA-C  lidocaine (LIDODERM) 5 % Place 1 patch onto the skin daily. Remove & Discard patch within 12 hours or as directed by MD 03/21/21   Frederik Pear A, PA-C      Allergies    Patient has no known allergies.    Review of Systems   Review of Systems  HENT: Negative.    Respiratory: Negative.    Cardiovascular: Negative.   Gastrointestinal: Negative.   Genitourinary: Negative.  Negative for difficulty urinating.  Musculoskeletal:        Left shoulder pain  All other systems reviewed and are negative.  Physical Exam Updated Vital Signs BP 129/87 (BP Location: Left Arm)   Pulse 100   Temp 98.9 F (37.2 C) (Oral)   Resp 18   Ht 5\' 5"  (1.651 m)   Wt 66.2 kg   LMP 04/15/2022   SpO2 98%   BMI 24.30 kg/m  Physical Exam Vitals and nursing note reviewed.  Constitutional:      General: She is not in acute distress.    Appearance: She is well-developed. She is not  ill-appearing, toxic-appearing or diaphoretic.  HENT:     Head: Normocephalic and atraumatic.  Eyes:     Pupils: Pupils are equal, round, and reactive to light.  Neck:     Comments: No midline tenderness Cardiovascular:     Rate and Rhythm: Normal rate.     Pulses: Normal pulses.          Radial pulses are 2+ on the right side and 2+ on the left side.     Heart sounds: Normal heart sounds.  Pulmonary:     Effort: No respiratory distress.  Abdominal:     General: There is no distension.  Musculoskeletal:        General: Normal range of motion.     Cervical back: Normal range of motion and neck supple.     Comments: No midline C/T/L tenderness Nontender clavicle, scapula Mild tenderness left superior aspect shoulder.  Nontender mid, distal humerus.  Nontender olecranon, forearm, hand. Full passive range of motion Tenderness with active range of motion left shoulder overhead.  Negative Hawking, empty can.  Skin:    General: Skin is warm and dry.     Capillary Refill: Capillary refill takes less than 2 seconds.     Comments: No edema, erythema or warmth.  No  fluctuance induration.  No contusions or abrasions  Neurological:     General: No focal deficit present.     Mental Status: She is alert and oriented to person, place, and time.     Cranial Nerves: Cranial nerves 2-12 are intact.     Sensory: Sensation is intact.     Motor: Motor function is intact.     Gait: Gait is intact.     Comments: Intact sensation Equal strength  Psychiatric:        Mood and Affect: Mood normal.    ED Results / Procedures / Treatments   Labs (all labs ordered are listed, but only abnormal results are displayed) Labs Reviewed - No data to display  EKG None  Radiology DG Shoulder Left  Result Date: 04/23/2022 CLINICAL DATA:  Shoulder injury EXAM: LEFT SHOULDER - 2+ VIEW COMPARISON:  None Available. FINDINGS: No acute fracture or dislocation. Joint spaces and alignment are maintained. No area  of erosion or osseous destruction. No unexpected radiopaque foreign body. Soft tissues are unremarkable. IMPRESSION: No acute fracture or dislocation. Electronically Signed   By: Meda Klinefelter M.D.   On: 04/23/2022 15:29    Procedures .Ortho Injury Treatment  Date/Time: 04/23/2022 3:49 PM Performed by: Ralph Leyden A, PA-C Authorized by: Linwood Dibbles, PA-C   Consent:    Consent obtained:  Verbal   Consent given by:  Patient   Risks discussed:  Fracture, nerve damage, restricted joint movement, stiffness, recurrent dislocation and irreducible dislocation   Alternatives discussed:  No treatment, alternative treatment, immobilization, referral and delayed treatmentInjury location: shoulder Location details: left shoulder Injury type: soft tissue Pre-procedure neurovascular assessment: neurovascularly intact Pre-procedure distal perfusion: normal Pre-procedure neurological function: normal Pre-procedure range of motion: normal  Anesthesia: Local anesthesia used: no  Patient sedated: NoImmobilization: sling Splint Applied by: ED Nurse Post-procedure neurovascular assessment: post-procedure neurovascularly intact Post-procedure distal perfusion: normal Post-procedure neurological function: normal Post-procedure range of motion: normal      Medications Ordered in ED Medications - No data to display  ED Course/ Medical Decision Making/ A&P    26 year old no chronic medical problems here for evaluation of mechanical fall out of a car when she saw a bug.  Car was not moving.  Denies hitting head, LOC or anticoagulation.  She has no evidence of acute intracranial, thoracic/ abd trauma. Neuro trauma.  She has no contusions, abrasions or lacerations.  No midline C/T/L tenderness.  Does have some mild tenderness to superior aspect left shoulder however nontender clavicle, scapula without any overlying skin changes.  Neurovascularly intact.  Negative Hawkins, empty can.   Compartments soft.  Suspect sprain or strain  Imaging personally viewed and interpreted:  X-ray left shoulder without any acute fracture, dislocation or effusion.  Patient placed in shoulder sling for comfort.  Discussed RICE for symptomatic management, gentle stretching exercises, NSAIDs as tolerated.  Follow-up with orthopedics if her symptoms are unresolved.  At this time based off history, exam and imaging I do not feel patient needs hospitalization at this time  The patient has been appropriately medically screened and/or stabilized in the ED. I have low suspicion for any other emergent medical condition which would require further screening, evaluation or treatment in the ED or require inpatient management.  Patient is hemodynamically stable and in no acute distress.  Patient able to ambulate in department prior to ED.  Evaluation does not show acute pathology that would require ongoing or additional emergent interventions while in the emergency department or further  inpatient treatment.  I have discussed the diagnosis with the patient and answered all questions.  Pain is been managed while in the emergency department and patient has no further complaints prior to discharge.  Patient is comfortable with plan discussed in room and is stable for discharge at this time.  I have discussed strict return precautions for returning to the emergency department.  Patient was encouraged to follow-up with PCP/specialist refer to at discharge.                           Medical Decision Making Amount and/or Complexity of Data Reviewed External Data Reviewed: radiology and notes. Radiology: ordered and independent interpretation performed. Decision-making details documented in ED Course.  Risk OTC drugs. Prescription drug management. Decision regarding hospitalization. Diagnosis or treatment significantly limited by social determinants of health.           Final Clinical Impression(s) / ED  Diagnoses Final diagnoses:  Fall, initial encounter  Injury of left shoulder, initial encounter    Rx / DC Orders ED Discharge Orders          Ordered    naproxen (NAPROSYN) 500 MG tablet  2 times daily        04/23/22 1551              Yashira Offenberger A, PA-C 04/23/22 1552    Alvira MondaySchlossman, Erin, MD 04/24/22 1026

## 2022-04-23 NOTE — ED Triage Notes (Addendum)
Pt reports landing on her left shoulder after falling out a car due to a bug. Reports the event happened last night at 10 pm.  ? ? ?A/Ox4 ?Ambulatory in triage.  ?

## 2022-08-19 IMAGING — US US BREAST*R* LIMITED INC AXILLA
1 series · 5 of 5 positions shown · non-contrast
Comparison: None.

CLINICAL DATA: 25-year-old female with pain and palpable lump for
approximately 1 month in the right breast.

EXAM:
ULTRASOUND OF THE RIGHT BREAST

[Series 1: us breast*right* limited inc axilla · 0.06mm/px · 5 of 5 slices shown]
[im 1/5]
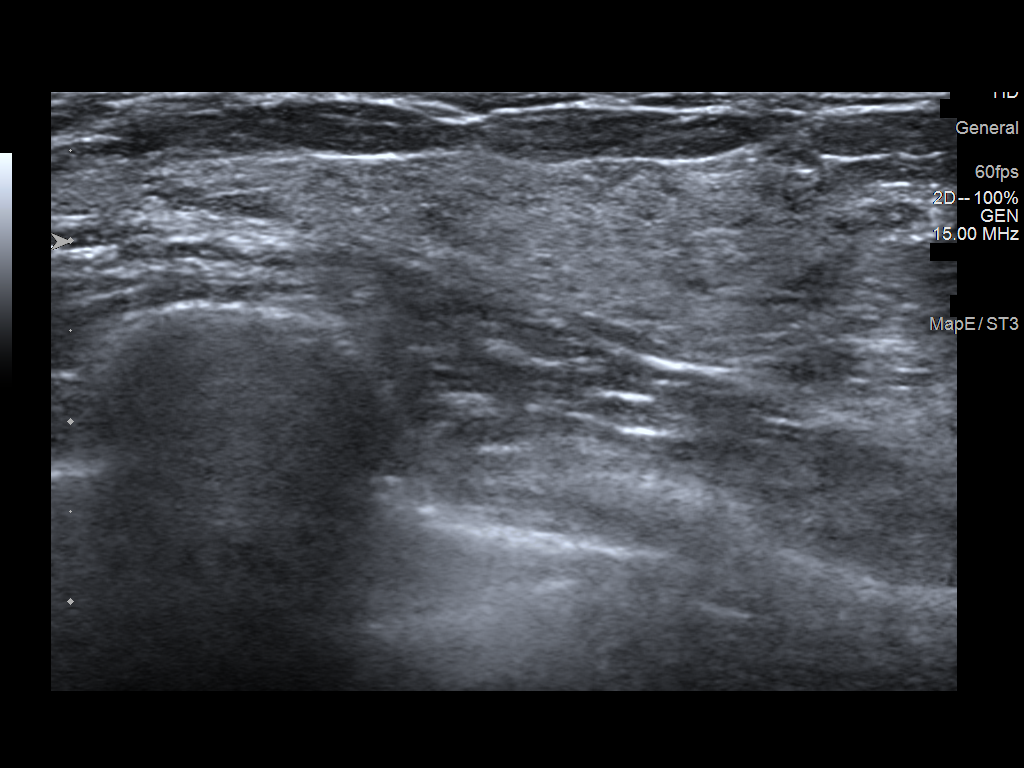
[im 2/5]
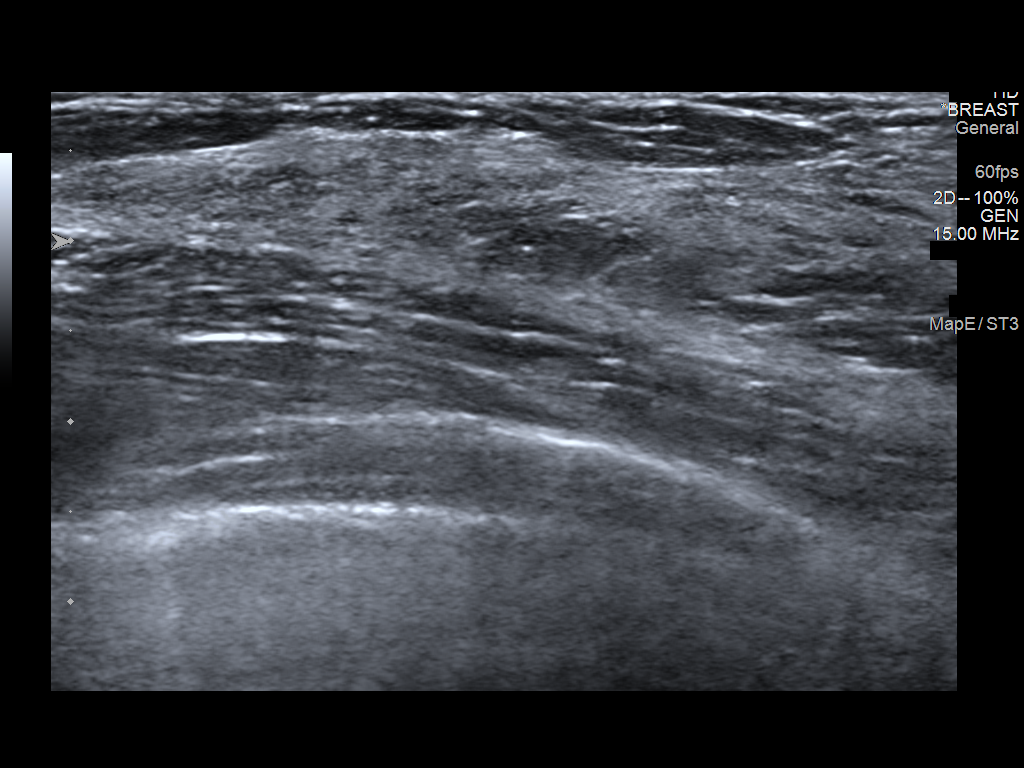
[im 3/5]
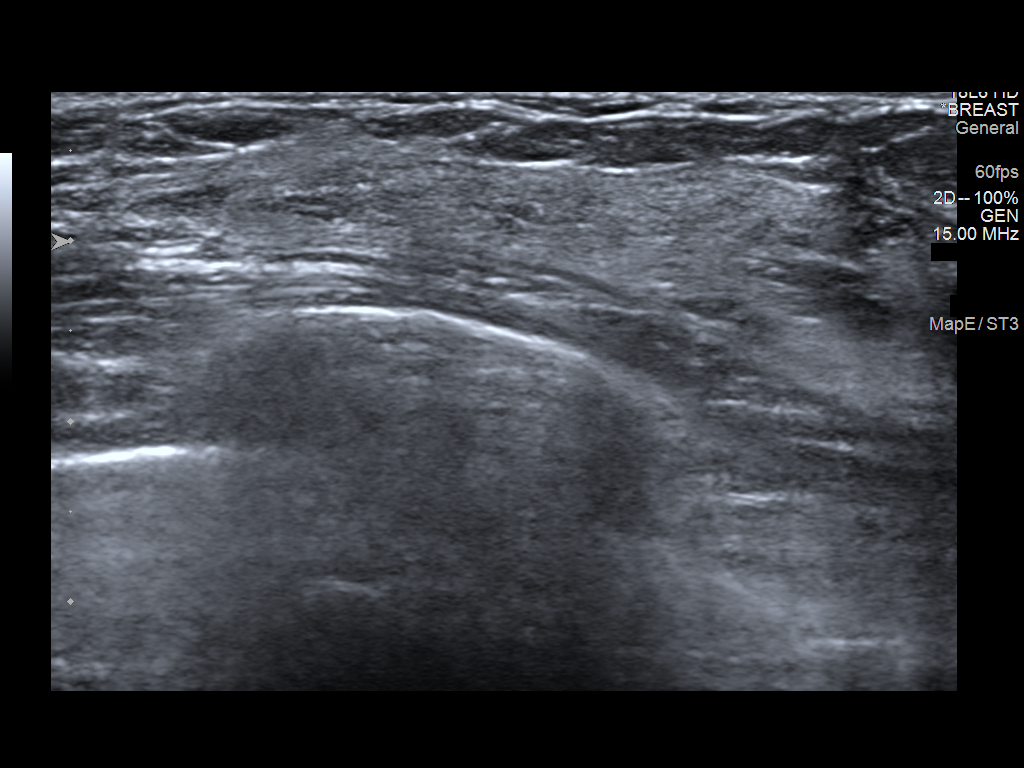
[im 4/5]
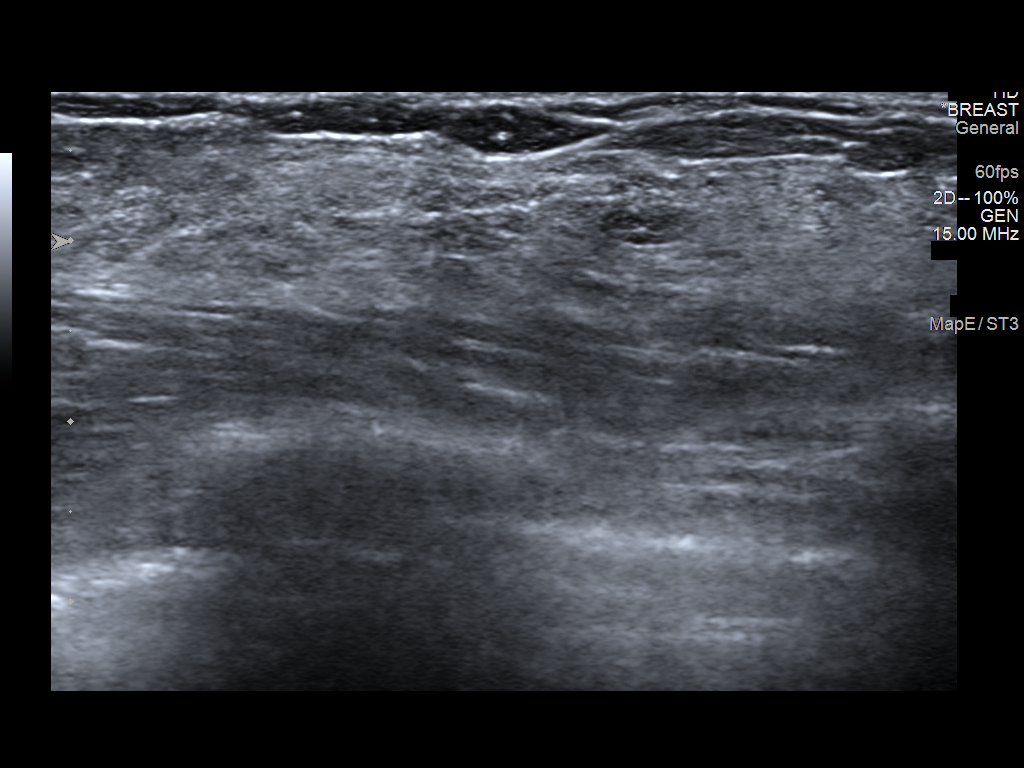
[im 5/5]
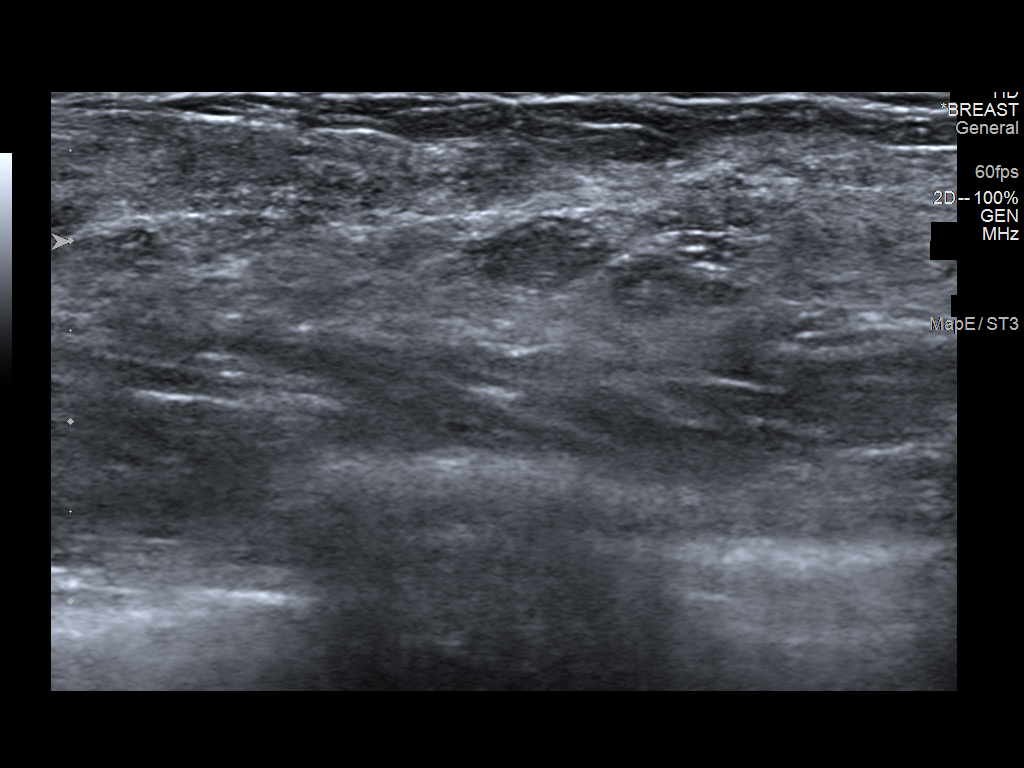

[5 of 5 positions shown; findings below may reference images not displayed]

FINDINGS: Targeted ultrasound is performed, showing normal fibroglandular
tissue without focal or suspicious sonographic abnormality in the
lateral and upper outer right breast.
IMPRESSION: Unremarkable ultrasound evaluation of the right breast.

RECOMMENDATION:
1. Clinical follow-up recommended for the symptomatic area of
concern in the right breast. Any further workup should be based on
clinical grounds.
2. Screening mammogram at age 38 unless there are persistent or
intervening clinical concerns. (Code:[id number])

I have discussed the findings and recommendations with the patient.
If applicable, a reminder letter will be sent to the patient
regarding the next appointment.

BI-RADS CATEGORY  1: Negative.

## 2022-08-24 ENCOUNTER — Encounter (HOSPITAL_COMMUNITY): Payer: Self-pay

## 2022-08-24 ENCOUNTER — Emergency Department (HOSPITAL_COMMUNITY)
Admission: EM | Admit: 2022-08-24 | Discharge: 2022-08-24 | Disposition: A | Discharge status: Home / Self Care | Payer: 59 | Attending: Emergency Medicine | Admitting: Emergency Medicine

## 2022-08-24 DIAGNOSIS — M79605 Pain in left leg: Secondary | ICD-10-CM | POA: Diagnosis present

## 2022-08-24 DIAGNOSIS — W228XXA Striking against or struck by other objects, initial encounter: Secondary | ICD-10-CM | POA: Diagnosis not present

## 2022-08-24 HISTORY — DX: Thyrotoxicosis with diffuse goiter without thyrotoxic crisis or storm: E05.00

## 2022-08-24 NOTE — ED Provider Triage Note (Signed)
Emergency Medicine Provider Triage Evaluation Note  Margaret Moon , a 26 y.o. female  was evaluated in triage.  Pt complains of pain and swelling over left lateral thigh.  Present for 3 weeks.  Patient fell and hit metal bed frame over this area.  Denies fever, chills.  Denies any drainage from this area.  Denies chest pain, shortness of breath.   Review of Systems  Positive: As above Negative: As above  Physical Exam  There were no vitals taken for this visit. Gen:   Awake, no distress   Resp:  Normal effort MSK:   Moves extremities without difficulty  Other:    Medical Decision Making  Medically screening exam initiated at 2:34 PM.  Appropriate orders placed.  Margaret Moon was informed that the remainder of the evaluation will be completed by another provider, this initial triage assessment does not replace that evaluation, and the importance of remaining in the ED until their evaluation is complete.    Evlyn Courier, PA-C 08/24/22 1435

## 2022-08-24 NOTE — ED Provider Notes (Signed)
[facility] DEPT Provider Note   CSN: [id number] Arrival date & time: 08/24/22  1421     History  Chief Complaint  Patient presents with   Leg Pain    Margaret Moon is a 26 y.o. female who presents to the emergency department with concerns for left leg pain onset 3 weeks.  Notes that she hit her left outer upper thigh on a bed frame 3 weeks ago and since has had pain to the area.  Has tried over-the-counter ibuprofen and Tylenol as well as ice and heat for her symptoms.  Has not been evaluated by her primary care provider.  Denies hitting her head, LOC, back pain, hip pain.  No anticoagulants at this time.  The history is provided by the patient. No language interpreter was used.       Home Medications Prior to Admission medications   Medication Sig Start Date End Date Taking? Authorizing Provider  lidocaine (LIDODERM) 5 % Place 1 patch onto the skin daily. Remove & Discard patch within 12 hours or as directed by MD 03/21/21   McDonald, Mia A, PA-C  naproxen (NAPROSYN) 500 MG tablet Take 1 tablet (500 mg total) by mouth 2 (two) times daily. 04/23/22   Henderly, Britni A, PA-C      Allergies    Patient has no known allergies.    Review of Systems   Review of Systems  All other systems reviewed and are negative.   Physical Exam Updated Vital Signs BP 138/83 (BP Location: Right Arm)   Pulse 87   Temp 98.9 F (37.2 C) (Oral)   Resp 16   Ht 5\' 5"  (1.651 m)   Wt 72.6 kg   LMP  (Within Weeks)   SpO2 100%   BMI 26.63 kg/m  Physical Exam Vitals and nursing note reviewed.  Constitutional:      General: She is not in acute distress.    Appearance: Normal appearance.  Eyes:     General: No scleral icterus.    Extraocular Movements: Extraocular movements intact.  Cardiovascular:     Rate and Rhythm: Normal rate.  Pulmonary:     Effort: Pulmonary effort is normal. No respiratory distress.  Abdominal:     Palpations: Abdomen is soft. There  is no mass.     Tenderness: There is no abdominal tenderness.  Musculoskeletal:        General: Normal range of motion.     Cervical back: Neck supple.     Comments: RN chaperone present for exam, no obvious bruising noted to left upper lateral thigh. No TTP noted to the area. No TTP noted to left hip or femur. Full ROM of left leg without difficulty. Able to ambulate without assistance or difficulty.   Skin:    General: Skin is warm and dry.     Findings: No rash.  Neurological:     Mental Status: She is alert.     Sensory: Sensation is intact.     Motor: Motor function is intact.  Psychiatric:        Behavior: Behavior normal.    ED Results / Procedures / Treatments   Labs (all labs ordered are listed, but only abnormal results are displayed) Labs Reviewed - No data to display  EKG None  Radiology No results found.  Procedures Procedures    Medications Ordered in ED Medications - No data to display  ED Course/ Medical Decision Making/ A&P  Medical Decision Making  Pt presents with concerns for left upper leg pain onset 3 weeks. She notes that she hit her left upper leg on a bed frame prior to the onset of her symptoms. Has tried conservative therapies at home. Hasn't been evaluated by her PCP. Pt afebrile. RN chaperone present for exam, no obvious bruising noted to left upper lateral thigh. No TTP noted to the area. No TTP noted to left hip or femur. Full ROM of left leg without difficulty. Able to ambulate without assistance or difficulty. Differential diagnosis includes hematoma, cellulitis, abscess.    Disposition: Presentation suspicious for healing hematoma. Doubt abscess or cellulitis at this time. After consideration of the diagnostic results and the patients response to treatment, I feel that the patient would benefit from Discharge home. Supportive care measures and strict return precautions discussed with patient at bedside. Pt  acknowledges and verbalizes understanding. Pt appears safe for discharge. Follow up as indicated in discharge paperwork.    This chart was dictated using voice recognition software, Dragon. Despite the best efforts of this provider to proofread and correct errors, errors may still occur which can change documentation meaning.   Final Clinical Impression(s) / ED Diagnoses Final diagnoses:  Left leg pain    Rx / DC Orders ED Discharge Orders     None         Nansi Birmingham A, PA-C 08/24/22 2115    Bethann Berkshire, MD 08/29/22 1237

## 2022-08-24 NOTE — Discharge Instructions (Addendum)
It was a pleasure taking care of you today!   Call your primary care provider tomorrow to set up an appointment regarding todays ED visit. Continue using ice to the affected area for up to 15 minutes at a time. You may take over the counter 600 mg ibuprofen every 6 hours and alternate with 500 mg Tylenol every 6 hours as needed. Return to the ED if you are experiencing increasing/worsening pain, swelling, color change, or worsening symptoms.

## 2022-08-24 NOTE — ED Triage Notes (Signed)
Pt states that three weeks ago she bumped her L outer thigh into a bed frame and since has had a non-healing painful hematoma. Pt denies any fall/head injury. Ambulatory to triage.

## 2022-09-14 ENCOUNTER — Emergency Department (HOSPITAL_COMMUNITY)
Admission: EM | Admit: 2022-09-14 | Discharge: 2022-09-14 | Disposition: A | Discharge status: Home / Self Care | Payer: 59 | Attending: Emergency Medicine | Admitting: Emergency Medicine

## 2022-09-14 ENCOUNTER — Other Ambulatory Visit: Payer: Self-pay

## 2022-09-14 DIAGNOSIS — Z20822 Contact with and (suspected) exposure to covid-19: Secondary | ICD-10-CM | POA: Diagnosis not present

## 2022-09-14 DIAGNOSIS — J020 Streptococcal pharyngitis: Secondary | ICD-10-CM | POA: Insufficient documentation

## 2022-09-14 DIAGNOSIS — J029 Acute pharyngitis, unspecified: Secondary | ICD-10-CM | POA: Diagnosis present

## 2022-09-14 LAB — SARS CORONAVIRUS 2 BY RT PCR: SARS Coronavirus 2 by RT PCR: NEGATIVE

## 2022-09-14 LAB — GROUP A STREP BY PCR: Group A Strep by PCR: NOT DETECTED

## 2022-09-14 MED ORDER — PREDNISONE 20 MG PO TABS: 40.0000 mg | ORAL_TABLET | Freq: Every day | ORAL | 0 refills | Status: AC

## 2022-09-14 MED ORDER — PENICILLIN V POTASSIUM 500 MG PO TABS: 500.0000 mg | ORAL_TABLET | Freq: Two times a day (BID) | ORAL | 0 refills | Status: AC

## 2022-09-14 MED ORDER — DEXAMETHASONE SODIUM PHOSPHATE 10 MG/ML IJ SOLN
10.0000 mg | Freq: Once | INTRAMUSCULAR | Status: AC
  Administered 2022-09-14: 10 mg via INTRAMUSCULAR
  Filled 2022-09-14: qty 1

## 2022-09-14 MED ORDER — IBUPROFEN 200 MG PO TABS
600.0000 mg | ORAL_TABLET | Freq: Once | ORAL | Status: AC
  Administered 2022-09-14: 600 mg via ORAL
  Filled 2022-09-14: qty 3

## 2022-09-14 NOTE — ED Provider Notes (Signed)
[facility] DEPT Provider Note   CSN: [id number] Arrival date & time: 09/14/22  [id number]     History  Chief Complaint  Patient presents with   Sore Throat    Margaret Moon is a 26 y.o. female who presents to the emergency department with concerns for sore throat onset 1 week.  Denies sick contacts.  Has associated intermittent headache, painful swallowing, rhinorrhea, nasal congestion.  Tried Tylenol Sinus and congestion at 7 AM prior to arrival.  Denies trouble swallowing, fever. NKDA. No history of DM.   The history is provided by the patient. No language interpreter was used.       Home Medications Prior to Admission medications   Medication Sig Start Date End Date Taking? Authorizing Provider  penicillin v potassium (VEETID) 500 MG tablet Take 1 tablet (500 mg total) by mouth 2 (two) times daily for 10 days. 09/14/22 09/24/22 Yes Nikodem Leadbetter A, PA-C  predniSONE (DELTASONE) 20 MG tablet Take 2 tablets (40 mg total) by mouth daily for 5 days. 09/14/22 09/19/22 Yes Phill Steck A, PA-C  lidocaine (LIDODERM) 5 % Place 1 patch onto the skin daily. Remove & Discard patch within 12 hours or as directed by MD 03/21/21   McDonald, Mia A, PA-C  naproxen (NAPROSYN) 500 MG tablet Take 1 tablet (500 mg total) by mouth 2 (two) times daily. 04/23/22   Henderly, Britni A, PA-C      Allergies    Patient has no known allergies.    Review of Systems   Review of Systems  Constitutional:  Negative for fever.  HENT:  Positive for congestion, rhinorrhea and sore throat. Negative for trouble swallowing.   Respiratory:  Positive for cough.   All other systems reviewed and are negative.   Physical Exam Updated Vital Signs BP (!) 146/104   Pulse (!) 114   Temp 98.3 F (36.8 C)   Resp 20   Ht 5\' 5"  (1.651 m)   Wt 72 kg   LMP 09/07/2022 (Within Weeks)   SpO2 98%   BMI 26.41 kg/m  Physical Exam Vitals and nursing note reviewed.  Constitutional:       General: She is not in acute distress.    Appearance: She is not diaphoretic.  HENT:     Head: Normocephalic and atraumatic.     Mouth/Throat:     Mouth: Mucous membranes are moist.     Pharynx: Uvula midline. Oropharyngeal exudate and posterior oropharyngeal erythema present. No uvula swelling.     Tonsils: Tonsillar exudate present. No tonsillar abscesses.     Comments: Posterior pharyngeal erythema as well as tonsillar exudate noted on exam.  No appreciable area of fluctuance noted to bilateral tonsils.  Uvula without swelling.  Tolerating secretions well.  Able to speak in clear complete sentences. Eyes:     General: No scleral icterus.    Conjunctiva/sclera: Conjunctivae normal.  Cardiovascular:     Rate and Rhythm: Normal rate and regular rhythm.     Pulses: Normal pulses.     Heart sounds: Normal heart sounds.  Pulmonary:     Effort: Pulmonary effort is normal. No respiratory distress.     Breath sounds: Normal breath sounds. No wheezing.  Abdominal:     General: Bowel sounds are normal. There is no distension.     Palpations: Abdomen is soft.  Musculoskeletal:        General: Normal range of motion.     Cervical back: Normal range of motion and  neck supple.  Skin:    General: Skin is warm and dry.  Neurological:     Mental Status: She is alert.  Psychiatric:        Behavior: Behavior normal.     ED Results / Procedures / Treatments   Labs (all labs ordered are listed, but only abnormal results are displayed) Labs Reviewed  SARS CORONAVIRUS 2 BY RT PCR  GROUP A STREP BY PCR    EKG None  Radiology No results found.  Procedures Procedures    Medications Ordered in ED Medications  dexamethasone (DECADRON) injection 10 mg (10 mg Intramuscular Given 09/14/22 0937)  ibuprofen (ADVIL) tablet 600 mg (600 mg Oral Given 09/14/22 3500)    ED Course/ Medical Decision Making/ A&P Clinical Course as of 09/14/22 1102  Sun Sep 14, 2022  1025 Pt re-evaluated and noted  mild improvement of symptoms with treatment regimen on exam. [SB]  9381 Discussed with patient lab findings. Pt able to tolerate PO intake prior to discharge without difficulty. Discussed with patient discharge treatment plan. Answered all available questions. Pt appears safe for discharge. [SB]    Clinical Course User Index [SB] Alazar Cherian A, PA-C                           Medical Decision Making Risk OTC drugs. Prescription drug management.   Patient with sore throat onset 1 week.  Denies sick contacts.  Tried OTC meds without relief. On exam patient with Posterior pharyngeal erythema as well as tonsillar exudate noted on exam.  No appreciable area of fluctuance noted to bilateral tonsils.  Uvula without swelling.  Tolerating secretions well.  Able to speak in clear complete sentences.  No acute cardiovascular, respiratory exam findings.  Differential diagnosis includes strep pharyngitis, peritonsillar abscess, strep pharyngitis, Ludwig's angina, COVID, flu.    Labs:  I ordered, and personally interpreted labs.  The pertinent results include:   COVID and flu swab negative Strep swab negative.  Medications:  I ordered medication including decadron and ibuprofen for symptom management Reevaluation of the patient after these medicines and interventions, I reevaluated the patient and found that they have improved I have reviewed the patients home medicines and have made adjustments as needed Pt tolerating PO intake in the ED without difficulty.    Disposition: Pt presentation suspicious for strep pharyngitis, due to posterior pharyngeal erythema and tonsillar exudate noted on exam. Doubt COVID or flu at this time. Patent airway, tolerating secretions, no concern for airway compromise. Less likely Ludwigs angina, no trismus or edema to floor of mouth on exam. Less likely peritonsillar abscess, no fluctuant abscess noted on exam, patent airway, oxygen saturation 100%, water and tolerating  secretions. Offered patient one-time penicillin dose IM in the ED, to which patient declined and opted for prescription for penicillin, pt will be discharged with penicillin prescription.  Also will be discharged with a prescription for prednisone.  After consideration of the diagnostic results and the patients response to treatment, I feel that the patient would benefit from discharge home.  Supportive care measures and strict return precautions discussed with patient at bedside.  Patient acknowledges and verbalizes understanding.  Recommended primary care follow-up.  Patient appears safe for discharge at this time.  Follow-up as indicated in discharge paperwork.  This chart was dictated using voice recognition software, Dragon. Despite the best efforts of this provider to proofread and correct errors, errors may still occur which can change documentation meaning.  Final Clinical Impression(s) / ED Diagnoses Final diagnoses:  Strep pharyngitis    Rx / DC Orders ED Discharge Orders          Ordered    penicillin v potassium (VEETID) 500 MG tablet  2 times daily        09/14/22 1050    predniSONE (DELTASONE) 20 MG tablet  Daily        09/14/22 1050              Nadyne Gariepy A, PA-C 09/14/22 1102    Hayden Rasmussen, MD 09/14/22 [phone]

## 2022-09-14 NOTE — Discharge Instructions (Addendum)
It was a pleasure taking care of you today!  Your strep and covid swabs were negative in the ED. You will be sent a prescription for penicillin and prednisone, take as directed. You may take over-the-counter 600 mg ibuprofen every 6 hours and alternate with 500 mg Tylenol every 6 hours as needed for pain for no more than 7 days.  Ensure to maintain fluid intake with tea, broth, soup, Gatorade, Pedialyte, water.  You may follow-up with your primary care provider as needed.  Return to the emergency department if experiencing increasing/worsening trouble swallowing, trouble breathing, fever, or worsening symptoms.

## 2022-09-14 NOTE — ED Triage Notes (Signed)
Pt complaining of sore throat x 1 week with difficulty swallowing and intermittent headache. Tylenol sinus and congestion @ 0700.   swelling noted on assessment.

## 2022-10-22 IMAGING — CR DG SHOULDER 2+V*L*
3 series · 3 of 3 positions shown · non-contrast
Comparison: None Available.

CLINICAL DATA: Shoulder injury

EXAM:
LEFT SHOULDER - 2+ VIEW

[w shoulder external left]
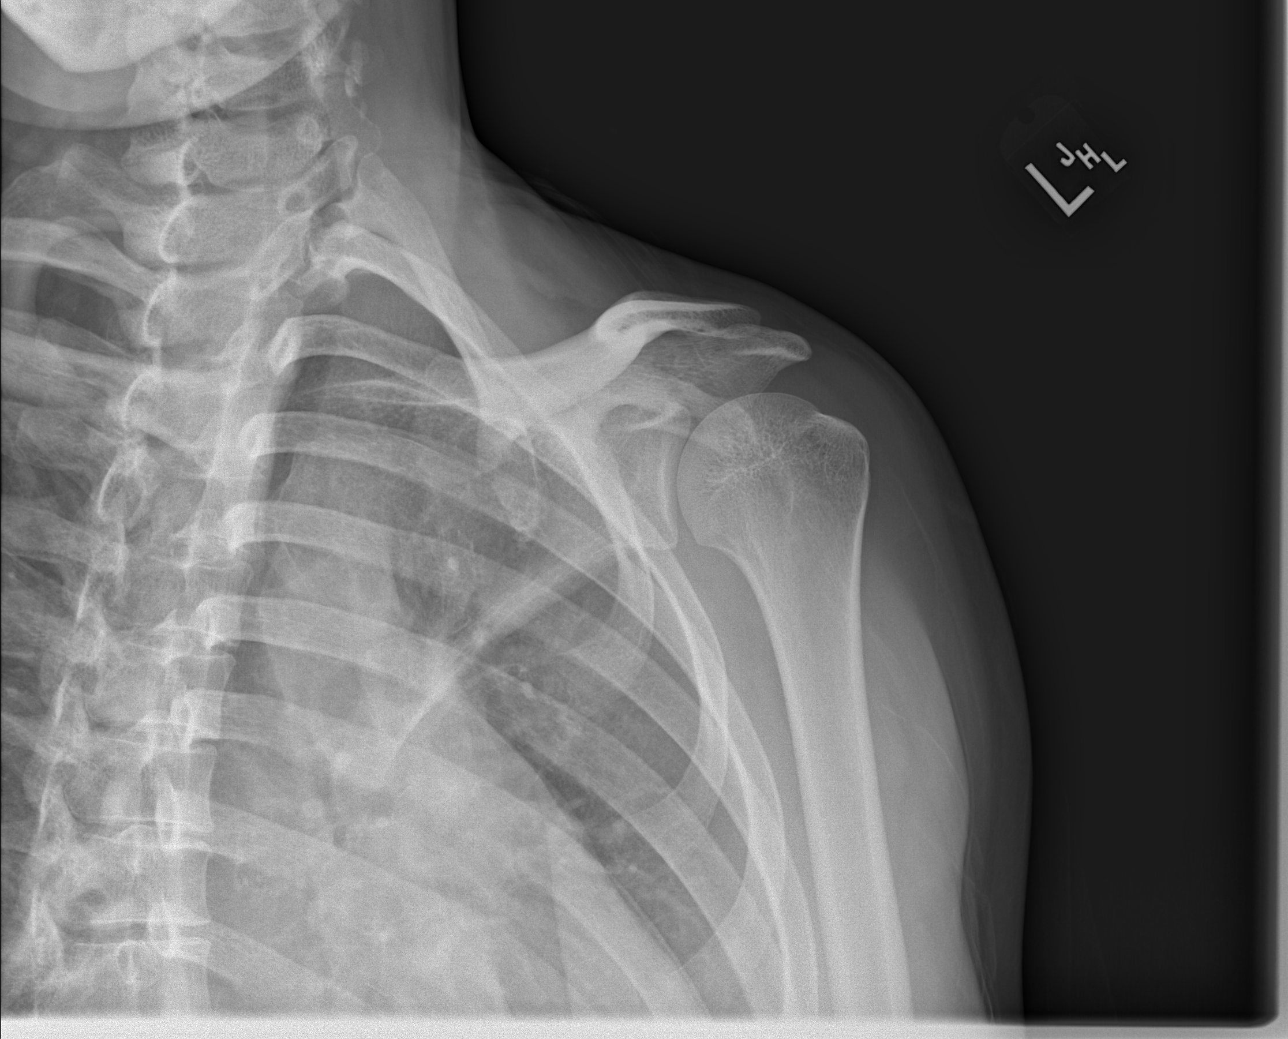

[w shoulder y-view left]
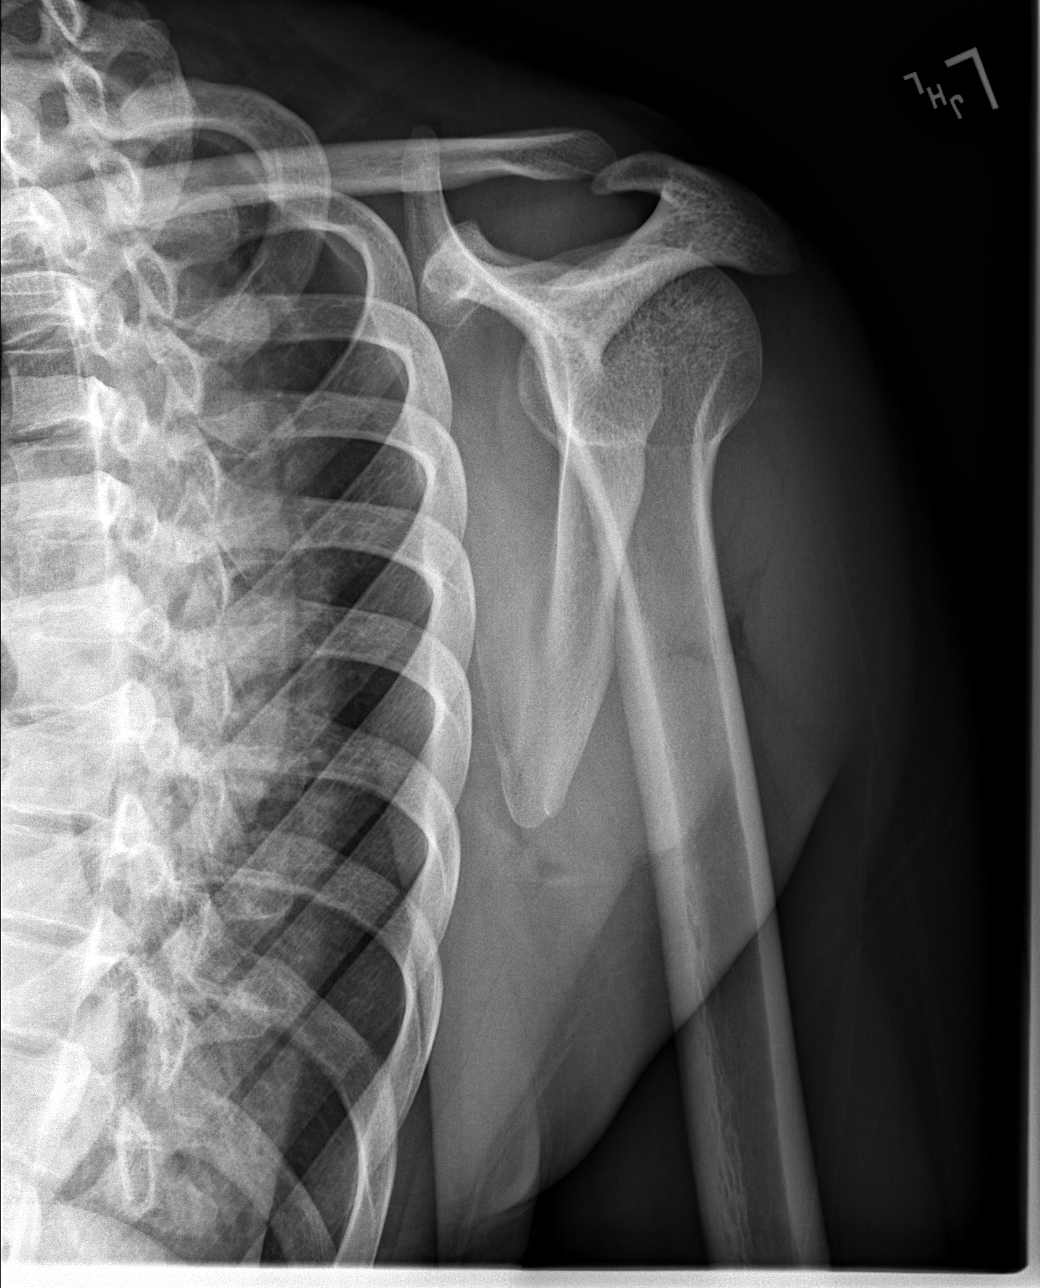

[x shoulder axillary left]
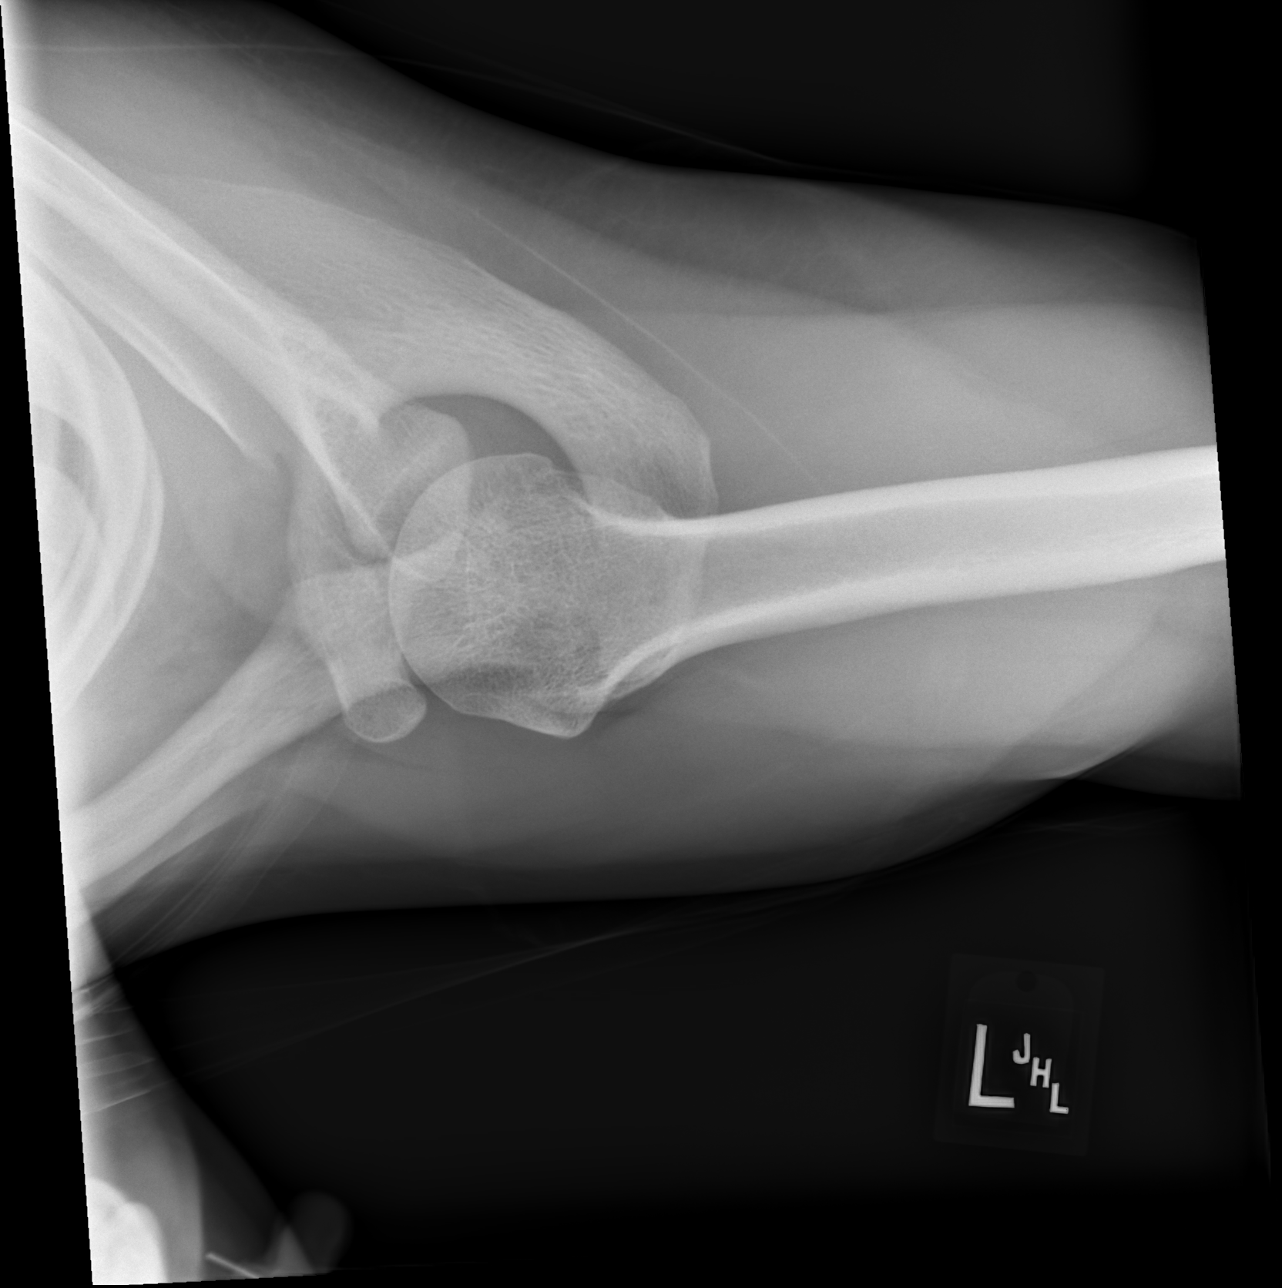

[3 of 3 positions shown; findings below may reference images not displayed]

FINDINGS: No acute fracture or dislocation. Joint spaces and alignment are
maintained. No area of erosion or osseous destruction. No unexpected
radiopaque foreign body. Soft tissues are unremarkable.
IMPRESSION: No acute fracture or dislocation.

## 2024-01-06 ENCOUNTER — Emergency Department (HOSPITAL_COMMUNITY)
Admission: EM | Admit: 2024-01-06 | Discharge: 2024-01-06 | Disposition: A | Discharge status: Home / Self Care | Payer: BLUE CROSS/BLUE SHIELD | Attending: Emergency Medicine | Admitting: Emergency Medicine

## 2024-01-06 ENCOUNTER — Other Ambulatory Visit: Payer: Self-pay

## 2024-01-06 DIAGNOSIS — U071 COVID-19: Secondary | ICD-10-CM | POA: Insufficient documentation

## 2024-01-06 DIAGNOSIS — R509 Fever, unspecified: Secondary | ICD-10-CM | POA: Diagnosis present

## 2024-01-06 LAB — RESP PANEL BY RT-PCR (RSV, FLU A&B, COVID)  RVPGX2
Influenza A by PCR: NEGATIVE
Influenza B by PCR: NEGATIVE
Resp Syncytial Virus by PCR: NEGATIVE
SARS Coronavirus 2 by RT PCR: POSITIVE — AB

## 2024-01-06 MED ORDER — ACETAMINOPHEN 325 MG PO TABS
650.0000 mg | ORAL_TABLET | Freq: Once | ORAL | Status: AC
Start: 2024-01-06 — End: 2024-01-06
  Administered 2024-01-06: 650 mg via ORAL
  Filled 2024-01-06: qty 2

## 2024-01-06 MED ORDER — GUAIFENESIN 100 MG/5ML PO LIQD: 5.0000 mL | ORAL | 0 refills | Status: AC | PRN

## 2024-01-06 MED ORDER — GUAIFENESIN 100 MG/5ML PO LIQD
5.0000 mL | Freq: Once | ORAL | Status: AC
  Administered 2024-01-06: 5 mL via ORAL
  Filled 2024-01-06: qty 10

## 2024-01-06 NOTE — ED Triage Notes (Signed)
Patient to ED by POV with c/o fever and chills. She states symptoms started yesterday. She voices back pain and neck pain with productive cough.

## 2024-01-06 NOTE — ED Provider Notes (Signed)
[place] EMERGENCY DEPARTMENT AT [facility] Provider Note   CSN: [id number] Arrival date & time: 01/06/24  3244     History  Chief Complaint  Patient presents with   Fever   Chills    Margaret Moon is a 28 y.o. female with a history of Graves' disease who presents the ED today for fever and chills.  Patient reports that yesterday she started develop a slight cough and had a fever of 102 F last night.  She states that since that time her symptoms have worsened and she is having bodyaches as well.  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 Endorses an episode of diarrhea while here in the ED, otherwise no nausea, vomiting, or abdominal pain. No additional complaints or concerns at this time.    Home Medications Prior to Admission medications   Medication Sig Start Date End Date Taking? Authorizing Provider  guaiFENesin (ROBITUSSIN) 100 MG/5ML liquid Take 5 mLs by mouth every 4 (four) hours as needed for up to 7 days for cough or to loosen phlegm. 01/06/24 01/13/24 Yes Maxwell Marion, PA-C      Allergies    Patient has no known allergies.    Review of Systems   Review of Systems  Constitutional:  Positive for fever.  All other systems reviewed and are negative.   Physical Exam Updated Vital Signs BP (!) 160/82 (BP Location: Left Arm)   Pulse (!) 110   Temp 99 F (37.2 C) (Oral)   Resp 18   Ht 5\' 3"  (1.6 m)   Wt 73.8 kg   LMP 12/23/2023   SpO2 99%   BMI 28.80 kg/m  Physical Exam Vitals and nursing note reviewed.  Constitutional:      Appearance: Normal appearance.  HENT:     Head: Normocephalic and atraumatic.     Mouth/Throat:     Mouth: Mucous membranes are moist.  Eyes:     Conjunctiva/sclera: Conjunctivae normal.     Pupils: Pupils are equal, round, and reactive to light.  Cardiovascular:     Rate and Rhythm: Normal rate and regular rhythm.     Pulses: Normal pulses.     Heart  sounds: Normal heart sounds.  Pulmonary:     Effort: Pulmonary effort is normal.     Breath sounds: Normal breath sounds.  Abdominal:     Palpations: Abdomen is soft.     Tenderness: There is no abdominal tenderness.  Musculoskeletal:        General: Normal range of motion.     Cervical back: Normal range of motion.  Skin:    General: Skin is warm and dry.     Findings: No rash.  Neurological:     General: No focal deficit present.     Mental Status: She is alert.  Psychiatric:        Mood and Affect: Mood normal.        Behavior: Behavior normal.    ED Results / Procedures / Treatments   Labs (all labs ordered are listed, but only abnormal results are displayed) Labs Reviewed  RESP PANEL BY RT-PCR (RSV, FLU A&B, COVID)  [id number] - Abnormal; Notable for the following components:      Result Value   SARS Coronavirus 2 by RT PCR POSITIVE (*)    All other components within normal limits    EKG None  Radiology No results found.  Procedures  Procedures: not indicated.   Medications Ordered in ED Medications  acetaminophen (TYLENOL) tablet 650 mg (has no administration in time range)  guaiFENesin (ROBITUSSIN) 100 MG/5ML liquid 5 mL (has no administration in time range)    ED Course/ Medical Decision Making/ A&P                                 Medical Decision Making Risk OTC drugs.   This patient presents to the ED for concern of fever, this involves an extensive number of treatment options, and is a complaint that carries with it a high risk of complications and morbidity.   Differential diagnosis includes: Flu, COVID, RSV, other viral illness, etc.   Comorbidities  See HPI above   Additional History  Additional history obtained from prior records.   Lab Tests  I ordered and personally interpreted labs.  The pertinent results include:   Respiratory panel positive for COVID   Problem List / ED Course / Critical Interventions / Medication  Management  Cough, fevers, and chills beginning yesterday with a Tmax of 102 F.  Patient states that her symptoms feel worse today. Patient tested positive for COVID.  Tylenol given for body aches and Robitussin given for cough prior to discharge. Prescription for Robitussin sent to the pharmacy.   Social Determinants of Health  Occupation   Test / Admission - Considered  Discussed findings with patient.  All questions were answered. She is stable and safe discharge home. Advised close PCP follow-up for reevaluation of elevated blood pressure in the ED today. Return precautions provided.       Final Clinical Impression(s) / ED Diagnoses Final diagnoses:  COVID    Rx / DC Orders ED Discharge Orders          Ordered    guaiFENesin (ROBITUSSIN) 100 MG/5ML liquid  Every 4 hours PRN        01/06/24 1215              Maxwell Marion, PA-C 01/06/24 1221    Linwood Dibbles, MD 01/07/24 [phone]

## 2024-01-06 NOTE — Discharge Instructions (Addendum)
As discussed, you tested positive for COVID. Alternate between tylenol and ibuprofen every 4 hours as needed for body aches, headaches, and fevers. Take robitussin as needed for cough.  Your blood pressure was elevated in the ER today. Follow up with your PCP in a week for reevaluation of your symptoms and your BP.  Get help right away if: You have trouble breathing or get short of breath. You have pain or pressure in your chest. You cannot speak or move any part of your body. You are confused. Your symptoms get worse.
